# Patient Record
Sex: Male | Born: 1955 | Race: White | Hispanic: No | Marital: Married | Smoking: Never smoker
Health system: Southern US, Community
[De-identification: ages and names within clinical notes are randomized; demographics above are authoritative.]

## PROBLEM LIST (undated history)

## (undated) DIAGNOSIS — J309 Allergic rhinitis, unspecified: Secondary | ICD-10-CM

## (undated) DIAGNOSIS — N529 Male erectile dysfunction, unspecified: Secondary | ICD-10-CM

## (undated) DIAGNOSIS — F411 Generalized anxiety disorder: Secondary | ICD-10-CM

## (undated) DIAGNOSIS — E785 Hyperlipidemia, unspecified: Secondary | ICD-10-CM

## (undated) DIAGNOSIS — Z8781 Personal history of (healed) traumatic fracture: Secondary | ICD-10-CM

## (undated) DIAGNOSIS — R7401 Elevation of levels of liver transaminase levels: Secondary | ICD-10-CM

## (undated) DIAGNOSIS — Z87438 Personal history of other diseases of male genital organs: Secondary | ICD-10-CM

## (undated) DIAGNOSIS — H3320 Serous retinal detachment, unspecified eye: Secondary | ICD-10-CM

## (undated) DIAGNOSIS — E119 Type 2 diabetes mellitus without complications: Secondary | ICD-10-CM

## (undated) DIAGNOSIS — R740 Nonspecific elevation of levels of transaminase and lactic acid dehydrogenase [LDH]: Secondary | ICD-10-CM

## (undated) DIAGNOSIS — G4733 Obstructive sleep apnea (adult) (pediatric): Secondary | ICD-10-CM

## (undated) DIAGNOSIS — E8881 Metabolic syndrome: Principal | ICD-10-CM

## (undated) DIAGNOSIS — L739 Follicular disorder, unspecified: Principal | ICD-10-CM

## (undated) DIAGNOSIS — I4891 Unspecified atrial fibrillation: Secondary | ICD-10-CM

## (undated) DIAGNOSIS — R7303 Prediabetes: Secondary | ICD-10-CM

## (undated) HISTORY — DX: Type 2 diabetes mellitus without complications: E11.9

## (undated) HISTORY — DX: Unspecified atrial fibrillation: I48.91

## (undated) HISTORY — DX: Follicular disorder, unspecified: L73.9

## (undated) HISTORY — DX: Personal history of (healed) traumatic fracture: Z87.81

## (undated) HISTORY — DX: Nonspecific elevation of levels of transaminase and lactic acid dehydrogenase (ldh): R74.0

## (undated) HISTORY — DX: Generalized anxiety disorder: F41.1

## (undated) HISTORY — PX: EYE SURGERY: SHX253

## (undated) HISTORY — DX: Male erectile dysfunction, unspecified: N52.9

## (undated) HISTORY — DX: Elevation of levels of liver transaminase levels: R74.01

## (undated) HISTORY — PX: VASECTOMY: SHX75

## (undated) HISTORY — PX: INGUINAL HERNIA REPAIR: SHX194

## (undated) HISTORY — DX: Hyperlipidemia, unspecified: E78.5

## (undated) HISTORY — DX: Obstructive sleep apnea (adult) (pediatric): G47.33

## (undated) HISTORY — DX: Personal history of other diseases of male genital organs: Z87.438

## (undated) HISTORY — DX: Prediabetes: R73.03

## (undated) HISTORY — DX: Serous retinal detachment, unspecified eye: H33.20

## (undated) HISTORY — DX: Metabolic syndrome: E88.81

## (undated) HISTORY — PX: SHOULDER ARTHROSCOPY W/ SUBACROMIAL DECOMPRESSION AND DISTAL CLAVICLE EXCISION: SHX2401

## (undated) HISTORY — DX: Allergic rhinitis, unspecified: J30.9

## (undated) HISTORY — PX: HEMORRHOID SURGERY: SHX153

## (undated) HISTORY — PX: FRACTURE SURGERY: SHX138

---

## 1981-10-04 HISTORY — PX: MANDIBLE FRACTURE SURGERY: SHX706

## 1995-10-05 DIAGNOSIS — Z8781 Personal history of (healed) traumatic fracture: Secondary | ICD-10-CM

## 1995-10-05 HISTORY — DX: Personal history of (healed) traumatic fracture: Z87.81

## 2003-10-05 HISTORY — PX: CERVICAL DISCECTOMY: SHX98

## 2004-10-04 DIAGNOSIS — I4891 Unspecified atrial fibrillation: Secondary | ICD-10-CM

## 2004-10-04 HISTORY — DX: Unspecified atrial fibrillation: I48.91

## 2009-10-04 DIAGNOSIS — N529 Male erectile dysfunction, unspecified: Secondary | ICD-10-CM

## 2009-10-04 HISTORY — DX: Male erectile dysfunction, unspecified: N52.9

## 2010-06-19 ENCOUNTER — Ambulatory Visit: Payer: Self-pay | Admitting: Unknown Physician Specialty

## 2010-06-19 HISTORY — PX: COLONOSCOPY: SHX174

## 2012-02-07 ENCOUNTER — Ambulatory Visit: Payer: Self-pay | Admitting: Family Medicine

## 2012-02-08 ENCOUNTER — Encounter: Payer: Self-pay | Admitting: Family Medicine

## 2012-02-08 ENCOUNTER — Ambulatory Visit (INDEPENDENT_AMBULATORY_CARE_PROVIDER_SITE_OTHER): Payer: 59 | Admitting: Family Medicine

## 2012-02-08 VITALS — BP 133/80 | HR 50 | Temp 97.0°F | Ht 73.0 in | Wt 219.8 lb

## 2012-02-08 DIAGNOSIS — Z0000 Encounter for general adult medical examination without abnormal findings: Secondary | ICD-10-CM

## 2012-02-08 DIAGNOSIS — Z23 Encounter for immunization: Secondary | ICD-10-CM

## 2012-02-08 DIAGNOSIS — N529 Male erectile dysfunction, unspecified: Secondary | ICD-10-CM

## 2012-02-08 DIAGNOSIS — Z125 Encounter for screening for malignant neoplasm of prostate: Secondary | ICD-10-CM

## 2012-02-08 DIAGNOSIS — I4891 Unspecified atrial fibrillation: Secondary | ICD-10-CM

## 2012-02-08 DIAGNOSIS — R5383 Other fatigue: Secondary | ICD-10-CM

## 2012-02-08 DIAGNOSIS — M533 Sacrococcygeal disorders, not elsewhere classified: Secondary | ICD-10-CM

## 2012-02-08 DIAGNOSIS — R5381 Other malaise: Secondary | ICD-10-CM

## 2012-02-08 DIAGNOSIS — I10 Essential (primary) hypertension: Secondary | ICD-10-CM

## 2012-02-08 MED ORDER — METOPROLOL TARTRATE 50 MG PO TABS: 50.0000 mg | ORAL_TABLET | Freq: Two times a day (BID) | ORAL | Status: DC

## 2012-02-08 MED ORDER — SILDENAFIL CITRATE 50 MG PO TABS: ORAL_TABLET | ORAL | Status: DC

## 2012-02-08 MED ORDER — FLECAINIDE ACETATE 50 MG PO TABS: 50.0000 mg | ORAL_TABLET | Freq: Two times a day (BID) | ORAL | Status: DC

## 2012-02-08 NOTE — Assessment & Plan Note (Signed)
Discussed options today. Decided to do plain film of sacrum/coccyx and refer to interventional pain mgmt MD Dr. Oneal Grout for consideration of repeat steroid/lidocaine injection at sacro-coccyx junction.  Take tylenol 1000mg  q6h prn and continue to use his donut when sitting on hard surfaces.

## 2012-02-08 NOTE — Assessment & Plan Note (Addendum)
Reviewed age and gender appropriate health maintenance issues (prudent diet, regular exercise, health risks of tobacco and excessive alcohol, use of seatbelts, fire alarms in home, use of sunscreen).  Also reviewed age and gender appropriate health screening as well as vaccine recommendations. Tdap given today.  He is UTD on colon cancer screening--next routine screening colonoscopy due 2022. Emphasized importance of intensifying exercise and increasing frequency of exercise.

## 2012-02-08 NOTE — Assessment & Plan Note (Signed)
With decreased energy level lately plus sleep dysfunction will check testosterone level, although he reports his sexual desire to be intact. Reassured pt that this can certainly be a problem totally unrelated to testosterone (stess, psychogenic, vascular, medication effect). Viagra 50mg  was successful in the past per pt so I did rx for this today.

## 2012-02-08 NOTE — Assessment & Plan Note (Signed)
He has been in sinus rhythm for years now since being on Flecainide and he'll continue this and we'll order referral to local [place] cardiologist for convenience now that he lives in this region.  Continue ASA 81mg  qd.

## 2012-02-08 NOTE — Patient Instructions (Signed)
Health Maintenance, Males A healthy lifestyle and preventative care can promote health and wellness.  Maintain regular health, dental, and eye exams.   Eat a healthy diet. Foods like vegetables, fruits, whole grains, low-fat dairy products, and lean protein foods contain the nutrients you need without too many calories. Decrease your intake of foods high in solid fats, added sugars, and salt. Get information about a proper diet from your caregiver, if necessary.   Regular physical exercise is one of the most important things you can do for your health. Most adults should get at least 150 minutes of moderate-intensity exercise (any activity that increases your heart rate and causes you to sweat) each week. In addition, most adults need muscle-strengthening exercises on 2 or more days a week.    Maintain a healthy weight. The body mass index (BMI) is a screening tool to identify possible weight problems. It provides an estimate of body fat based on height and weight. Your caregiver can help determine your BMI, and can help you achieve or maintain a healthy weight. For adults 20 years and older:   A BMI below 18.5 is considered underweight.   A BMI of 18.5 to 24.9 is normal.   A BMI of 25 to 29.9 is considered overweight.   A BMI of 30 and above is considered obese.   Maintain normal blood lipids and cholesterol by exercising and minimizing your intake of saturated fat. Eat a balanced diet with plenty of fruits and vegetables. Blood tests for lipids and cholesterol should begin at age 20 and be repeated every 5 years. If your lipid or cholesterol levels are high, you are over 50, or you are a high risk for heart disease, you may need your cholesterol levels checked more frequently.Ongoing high lipid and cholesterol levels should be treated with medicines, if diet and exercise are not effective.   If you smoke, find out from your caregiver how to quit. If you do not use tobacco, do not start.    If you choose to drink alcohol, do not exceed 2 drinks per day. One drink is considered to be 12 ounces (355 mL) of beer, 5 ounces (148 mL) of wine, or 1.5 ounces (44 mL) of liquor.   Avoid use of street drugs. Do not share needles with anyone. Ask for help if you need support or instructions about stopping the use of drugs.   High blood pressure causes heart disease and increases the risk of stroke. Blood pressure should be checked at least every 1 to 2 years. Ongoing high blood pressure should be treated with medicines if weight loss and exercise are not effective.   If you are 45 to 54 years old, ask your caregiver if you should take aspirin to prevent heart disease.   Diabetes screening involves taking a blood sample to check your fasting blood sugar level. This should be done once every 3 years, after age 45, if you are within normal weight and without risk factors for diabetes. Testing should be considered at a younger age or be carried out more frequently if you are overweight and have at least 1 risk factor for diabetes.   Colorectal cancer can be detected and often prevented. Most routine colorectal cancer screening begins at the age of 50 and continues through age 75. However, your caregiver may recommend screening at an earlier age if you have risk factors for colon cancer. On a yearly basis, your caregiver may provide home test kits to check for hidden   blood in the stool. Use of a small camera at the end of a tube, to directly examine the colon (sigmoidoscopy or colonoscopy), can detect the earliest forms of colorectal cancer. Talk to your caregiver about this at age 50, when routine screening begins. Direct examination of the colon should be repeated every 5 to 10 years through age 75, unless early forms of pre-cancerous polyps or small growths are found.   Hepatitis C blood testing is recommended for all people born from 1945 through 1965 and any individual with known risks for  hepatitis C.   Healthy men should no longer receive prostate-specific antigen (PSA) blood tests as part of routine cancer screening. Consult with your caregiver about prostate cancer screening.   Testicular cancer screening is not recommended for adolescents or adult males who have no symptoms. Screening includes self-exam, caregiver exam, and other screening tests. Consult with your caregiver about any symptoms you have or any concerns you have about testicular cancer.   Practice safe sex. Use condoms and avoid high-risk sexual practices to reduce the spread of sexually transmitted infections (STIs).   Use sunscreen with a sun protection factor (SPF) of 30 or greater. Apply sunscreen liberally and repeatedly throughout the day. You should seek shade when your shadow is shorter than you. Protect yourself by wearing long sleeves, pants, a wide-brimmed hat, and sunglasses year round, whenever you are outdoors.   Notify your caregiver of new moles or changes in moles, especially if there is a change in shape or color. Also notify your caregiver if a mole is larger than the size of a pencil eraser.   A one-time screening for abdominal aortic aneurysm (AAA) and surgical repair of large AAAs by sound wave imaging (ultrasonography) is recommended for ages 65 to 75 years who are current or former smokers.   Stay current with your immunizations.  Document Released: 03/18/2008 Document Revised: 09/09/2011 Document Reviewed: 02/15/2011 [employer] Patient Information 2012 [employer]. 

## 2012-02-08 NOTE — Progress Notes (Signed)
Office Note 02/08/2012  CC:  Chief Complaint  Patient presents with  . Establish Care    new patient    HPI:  Shawn Morales is a 56 y.o. White male who is here to establish care and is due for annual CPE. Patient's most recent primary MD: Dr. Robby Sermon in [place], [place].  Cardiologist is Dr. Cassie Freer in [place]. Old records were not reviewed prior to or during today's visit.  Main complaints:  1) erectile dysfunction, gradual onset, mild (firm enough for penetration but often doesn't maintain erection through orgasm).  Sexual desire is intact.  +Mild fatigue, insidious, mild sleep dysfunction last few weeks.  He wonders about his testosterone level.  2) pain in coccyx region for the last 7 years ever since a skateboarding accident--fell directly on coccyx. Has constant nagging discomfort that is intermittently worsened by sitting on certain hard seating, esp his motorcycle.  He recalls getting an injection a couple of months after the accident--in the region of the sacro-coccygeal junction but says it didn't help any.  Takes occasional tylenol or ibuprofen and it helps some.  Uses a soft donut cushion, esp when riding motorcycle.  Denies paresthesias in legs, weakness in legs, or loss of bowel/bladder control.    Past Medical History  Diagnosis Date  . Hypertension     Hx of good control  . Atrial fibrillation 2006    Converted to sinus with flecainide and has been stable since..  . Allergy     Past Surgical History  Procedure Date  . Cervical discectomy 2007    Very helpful:  twinge of residual numbness in tips of index and middle fingers on left hand.  . Colonoscopy 2012    Screening: normal per pt report [facility]; pt can't recall name of GI MD).    Family History  Problem Relation Age of Onset  . Hypertension Mother   . Leukemia Mother   . Stroke Father     History   Social History  . Marital Status: Married    Spouse Name: N/A    Number of Children: N/A  .  Years of Education: N/A   Occupational History  . Not on file.   Social History Main Topics  . Smoking status: Never Smoker   . Smokeless tobacco: Never Used  . Alcohol Use: No  . Drug Use: No  . Sexually Active: Not on file   Other Topics Concern  . 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    Outpatient Encounter Prescriptions as of 02/08/2012  Medication Sig Dispense Refill  . aspirin 81 MG tablet Take 81 mg by mouth daily.      . fish oil-omega-3 fatty acids 1000 MG capsule Take 2 g by mouth daily.      . flecainide (TAMBOCOR) 50 MG tablet Take 1 tablet (50 mg total) by mouth 2 (two) times daily.  60 tablet  6  . metoprolol (LOPRESSOR) 50 MG tablet Take 1 tablet (50 mg total) by mouth 2 (two) times daily.  60 tablet  6  . Multiple Vitamin (MULTIVITAMIN) tablet Take 1 tablet by mouth daily.      Marland Kitchen DISCONTD: flecainide (TAMBOCOR) 50 MG tablet Take 50 mg by mouth 2 (two) times daily.      Marland Kitchen DISCONTD: metoprolol (LOPRESSOR) 50 MG tablet Take 50 mg by mouth 2 (two) times daily.      Marland Kitchen  sildenafil (VIAGRA) 50 MG tablet 1-2 tabs po 30 min prior to intercourse.  Use only once per 24h.  10 tablet  6    No Known Allergies  ROS Review of Systems  Constitutional: Positive for fatigue (mild). Negative for fever, chills and appetite change.  HENT: Negative for ear pain, congestion, sore throat, neck stiffness and dental problem.   Eyes: Negative for discharge, redness and visual disturbance.  Respiratory: Negative for cough, chest tightness, shortness of breath and wheezing.   Cardiovascular: Negative for chest pain, palpitations and leg swelling.  Gastrointestinal: Negative for nausea, vomiting, abdominal pain, diarrhea and blood in stool.  Genitourinary: Negative for dysuria, urgency, frequency, hematuria, flank pain and difficulty urinating.    Musculoskeletal: Negative for myalgias, back pain, joint swelling and arthralgias.       Coccyx pain  Skin: Negative for pallor and rash.  Neurological: Negative for dizziness, speech difficulty, weakness and headaches.  Hematological: Negative for adenopathy. Does not bruise/bleed easily.  Psychiatric/Behavioral: Negative for confusion and sleep disturbance. The patient is not nervous/anxious.     PE; Blood pressure 133/80, pulse 50, temperature 97 F (36.1 C), temperature source Temporal, height 6\' 1"  (1.854 m), weight 219 lb 12.8 oz (99.701 kg), SpO2 94.00%. Gen: Alert, well appearing white male.  Patient is oriented to person, place, time, and situation. Affect is pleasant.  Thought and speech are lucid. ENT: Ears: EACs clear, normal epithelium.  TMs with good light reflex and landmarks bilaterally.  Eyes: no injection, icteris, swelling, or exudate.  EOMI, PERRLA. Nose: no drainage or turbinate edema/swelling.  No injection or focal lesion.  Mouth: lips without lesion/swelling.  Oral mucosa pink and moist.  Dentition intact and without obvious caries or gingival swelling.  Oropharynx without erythema, exudate, or swelling.  Neck: supple/nontender.  No LAD, mass, or TM.  Carotid pulses 2+ bilaterally, without bruits. CV: RRR, no m/r/g.   LUNGS: CTA bilat, nonlabored resps, good aeration in all lung fields. ABD: soft, NT, ND, BS normal.  No hepatospenomegaly or mass.  No bruits. EXT: no clubbing, cyanosis, or edema.  Genitals normal; both testes normal without tenderness, masses, hydroceles, varicoceles, erythema or swelling. Shaft normal, circumcised, meatus normal without discharge. No inguinal hernia noted. No inguinal lymphadenopathy. Rectal exam: negative without mass, lesions or tenderness, PROSTATE EXAM: smooth and symmetric without nodules or tenderness.  Pertinent labs:  None in office today  ASSESSMENT AND PLAN:   New pt: obtain old records.  Health maintenance  examination Reviewed age and gender appropriate health maintenance issues (prudent diet, regular exercise, health risks of tobacco and excessive alcohol, use of seatbelts, fire alarms in home, use of sunscreen).  Also reviewed age and gender appropriate health screening as well as vaccine recommendations. Tdap given today.  He is UTD on colon cancer screening--next routine screening colonoscopy due 2022. Emphasized importance of intensifying exercise and increasing frequency of exercise.  Coccydynia Discussed options today. Decided to do plain film of sacrum/coccyx and refer to interventional pain mgmt MD Dr. Oneal Grout for consideration of repeat steroid/lidocaine injection at sacro-coccyx junction.  Take tylenol 1000mg  q6h prn and continue to use his donut when sitting on hard surfaces.  Erectile dysfunction With decreased energy level lately plus sleep dysfunction will check testosterone level, although he reports his sexual desire to be intact. Reassured pt that this can certainly be a problem totally unrelated to testosterone (stess, psychogenic, vascular, medication effect). Viagra 50mg  was successful in the past per pt so I did rx for this today.  Prostate cancer screening DRE today normal. PSA ordered and will be done as part of his fasting labs when he returns for these ASAP.  Atrial fibrillation He has been in sinus rhythm for years now since being on Flecainide and he'll continue this and we'll order referral to local [place] cardiologist for convenience now that he lives in this region.  Continue ASA 81mg  qd.  HTN (hypertension), benign Problem stable.  Continue current medications and diet appropriate for this condition.  We have reviewed our general long term plan for this problem and also reviewed symptoms and signs that should prompt the patient to call or return to the office. Check Cr/lytes today. Will check FLP when fasting ASAP.     Return in about 6 months (around  08/10/2012) for f/u HTN and a-fib.

## 2012-02-08 NOTE — Assessment & Plan Note (Signed)
DRE today normal. PSA ordered and will be done as part of his fasting labs when he returns for these ASAP.

## 2012-02-08 NOTE — Assessment & Plan Note (Signed)
Problem stable.  Continue current medications and diet appropriate for this condition.  We have reviewed our general long term plan for this problem and also reviewed symptoms and signs that should prompt the patient to call or return to the office. Check Cr/lytes today. Will check FLP when fasting ASAP.

## 2012-02-09 ENCOUNTER — Other Ambulatory Visit (INDEPENDENT_AMBULATORY_CARE_PROVIDER_SITE_OTHER): Payer: 59

## 2012-02-09 ENCOUNTER — Other Ambulatory Visit: Payer: Self-pay | Admitting: Family Medicine

## 2012-02-09 ENCOUNTER — Encounter: Payer: Self-pay | Admitting: Family Medicine

## 2012-02-09 ENCOUNTER — Ambulatory Visit (HOSPITAL_BASED_OUTPATIENT_CLINIC_OR_DEPARTMENT_OTHER)
Admission: RE | Admit: 2012-02-09 | Discharge: 2012-02-09 | Disposition: A | Discharge status: Home / Self Care | Payer: 59 | Source: Ambulatory Visit | Attending: Family Medicine | Admitting: Family Medicine

## 2012-02-09 DIAGNOSIS — R7301 Impaired fasting glucose: Secondary | ICD-10-CM | POA: Insufficient documentation

## 2012-02-09 DIAGNOSIS — M545 Low back pain, unspecified: Secondary | ICD-10-CM | POA: Insufficient documentation

## 2012-02-09 DIAGNOSIS — N529 Male erectile dysfunction, unspecified: Secondary | ICD-10-CM

## 2012-02-09 DIAGNOSIS — Z0000 Encounter for general adult medical examination without abnormal findings: Secondary | ICD-10-CM

## 2012-02-09 DIAGNOSIS — G8929 Other chronic pain: Secondary | ICD-10-CM

## 2012-02-09 DIAGNOSIS — Z125 Encounter for screening for malignant neoplasm of prostate: Secondary | ICD-10-CM

## 2012-02-09 DIAGNOSIS — Z23 Encounter for immunization: Secondary | ICD-10-CM

## 2012-02-09 DIAGNOSIS — E291 Testicular hypofunction: Secondary | ICD-10-CM

## 2012-02-09 DIAGNOSIS — M533 Sacrococcygeal disorders, not elsewhere classified: Secondary | ICD-10-CM | POA: Insufficient documentation

## 2012-02-09 DIAGNOSIS — I4891 Unspecified atrial fibrillation: Secondary | ICD-10-CM

## 2012-02-09 DIAGNOSIS — R5381 Other malaise: Secondary | ICD-10-CM

## 2012-02-09 DIAGNOSIS — I10 Essential (primary) hypertension: Secondary | ICD-10-CM

## 2012-02-09 DIAGNOSIS — R5383 Other fatigue: Secondary | ICD-10-CM

## 2012-02-09 DIAGNOSIS — R7401 Elevation of levels of liver transaminase levels: Secondary | ICD-10-CM

## 2012-02-09 DIAGNOSIS — R7402 Elevation of levels of lactic acid dehydrogenase (LDH): Secondary | ICD-10-CM

## 2012-02-09 LAB — COMPREHENSIVE METABOLIC PANEL
ALT: 64 U/L — ABNORMAL HIGH (ref 0–53)
CO2: 29 mEq/L (ref 19–32)
Calcium: 9.8 mg/dL (ref 8.4–10.5)
Chloride: 101 mEq/L (ref 96–112)
Creatinine, Ser: 1 mg/dL (ref 0.4–1.5)
GFR: 83.65 mL/min (ref >60.00)
Glucose, Bld: 110 mg/dL — ABNORMAL HIGH (ref 70–99)
Total Protein: 7.5 g/dL (ref 6.0–8.3)

## 2012-02-09 LAB — LIPID PANEL
Total CHOL/HDL Ratio: 5
VLDL: 50.8 mg/dL — ABNORMAL HIGH (ref 0.0–40.0)

## 2012-02-09 LAB — CBC WITH DIFFERENTIAL/PLATELET
Basophils Relative: 0.6 % (ref 0.0–3.0)
Eosinophils Relative: 1.2 % (ref 0.0–5.0)
HCT: 46.4 % (ref 39.0–52.0)
Hemoglobin: 15.9 g/dL (ref 13.0–17.0)
Lymphs Abs: 1.7 10*3/uL (ref 0.7–4.0)
MCV: 88.8 fl (ref 78.0–100.0)
Monocytes Relative: 10.2 % (ref 3.0–12.0)
Platelets: 234 10*3/uL (ref 150.0–400.0)
RBC: 5.23 Mil/uL (ref 4.22–5.81)
WBC: 7.1 10*3/uL (ref 4.5–10.5)

## 2012-02-09 LAB — TESTOSTERONE: Testosterone: 294.25 ng/dL — ABNORMAL LOW (ref 350.00–890.00)

## 2012-02-09 LAB — TSH: TSH: 0.86 u[IU]/mL (ref 0.35–5.50)

## 2012-02-09 LAB — LDL CHOLESTEROL, DIRECT: Direct LDL: 127.8 mg/dL

## 2012-02-09 NOTE — Telephone Encounter (Signed)
Detailed message left on cell number that RX's were received by pharmacy on 5/7.  Flecainide and metoprolol were put on hold due to previous RX's in system and recent refill history.  Viagra may have also been put on hold, but pharmacist will try to fill again to see if there are any insurance issues.  Advised pt in voicemail to call pharmacy before going to pick up meds to make sure they are ready.  To call with any other questions/concerns.

## 2012-02-09 NOTE — Telephone Encounter (Signed)
I spoke with patient and told him that we would speak with [facility] verbally to ensure his medications are filled.

## 2012-02-16 ENCOUNTER — Institutional Professional Consult (permissible substitution): Payer: 59 | Admitting: Cardiology

## 2012-02-17 ENCOUNTER — Encounter: Payer: Self-pay | Admitting: Family Medicine

## 2012-04-03 ENCOUNTER — Telehealth: Payer: Self-pay | Admitting: Family Medicine

## 2012-04-03 NOTE — Telephone Encounter (Signed)
Sure.  If pt calls then we'll take it from there.--thx

## 2012-04-03 NOTE — Telephone Encounter (Signed)
Patty from [facility] called to say that Dr Oneal Grout is on bedrest for the next 6 weeks & that she would not be able to see the patient for 6 weeks. Can you manage his meds until she returns to work?

## 2012-04-11 ENCOUNTER — Ambulatory Visit (INDEPENDENT_AMBULATORY_CARE_PROVIDER_SITE_OTHER): Payer: 59 | Admitting: Family Medicine

## 2012-04-11 ENCOUNTER — Encounter: Payer: Self-pay | Admitting: Family Medicine

## 2012-04-11 VITALS — BP 134/80 | HR 59 | Temp 98.0°F | Resp 18 | Wt 220.1 lb

## 2012-04-11 DIAGNOSIS — L739 Follicular disorder, unspecified: Secondary | ICD-10-CM

## 2012-04-11 DIAGNOSIS — L738 Other specified follicular disorders: Secondary | ICD-10-CM

## 2012-04-11 DIAGNOSIS — I10 Essential (primary) hypertension: Secondary | ICD-10-CM

## 2012-04-11 DIAGNOSIS — I4891 Unspecified atrial fibrillation: Secondary | ICD-10-CM

## 2012-04-11 DIAGNOSIS — L678 Other hair color and hair shaft abnormalities: Secondary | ICD-10-CM

## 2012-04-11 MED ORDER — MUPIROCIN 2 % EX OINT: TOPICAL_OINTMENT | Freq: Every day | CUTANEOUS | Status: AC

## 2012-04-11 MED ORDER — SULFAMETHOXAZOLE-TRIMETHOPRIM 800-160 MG PO TABS: 1.0000 | ORAL_TABLET | Freq: Two times a day (BID) | ORAL | Status: AC

## 2012-04-11 NOTE — Assessment & Plan Note (Signed)
RRR today 

## 2012-04-11 NOTE — Assessment & Plan Note (Signed)
Suspicious for staph, culture taken today, started on Bactrim DS bid and Mupirocin to nares qhs x 7 days. Start a probiotic

## 2012-04-11 NOTE — Patient Instructions (Addendum)
Folliculitis  Folliculitis is an infection and inflammation of the hair follicles. Hair follicles become red and irritated. This inflammation is usually caused by bacteria. The bacteria thrive in warm, moist environments. This condition can be seen anywhere on the body.  CAUSES The most common cause of folliculitis is an infection by germs (bacteria). Fungal and viral infections can also cause the condition. Viral infections may be more common in people whose bodies are unable to fight disease well (weakened immune systems). Examples include people with:  AIDS.   An organ transplant.   Cancer.  People with depressed immune systems, diabetes, or obesity, have a greater risk of getting folliculitis than the general population. Certain chemicals, especially oils and tars, also can cause folliculitis. SYMPTOMS  An early sign of folliculitis is a small, white or yellow pus-filled, itchy lesion (pustule). These lesions appear on a red, inflamed follicle. They are usually less than 5 mm (.20 inches).   The most likely starting points are the scalp, thighs, legs, back and buttocks. Folliculitis is also frequently found in areas of repeated shaving.   When an infection of the follicle goes deeper, it becomes a boil or furuncle. A group of closely packed boils create a larger lesion (a carbuncle). These sores (lesions) tend to occur in hairy, sweaty areas of the body.  TREATMENT   A doctor who specializes in skin problems (dermatologists) treats mild cases of folliculitis with antiseptic washes.   They also use a skin application which kills germs (topical antibiotics). Tea tree oil is a good topical antiseptic as well. It can be found at a health food store. A small percentage of individuals may develop an allergy to the tea tree oil.   Mild to moderate boils respond well to warm water compresses applied three times daily.   In some cases, oral antibiotics should be taken with the skin treatment.     If lesions contain large quantities of pus or fluid, your caregiver may drain them. This allows the topical antibiotics to get to the affected areas better.   Stubborn cases of folliculitis may respond to laser hair removal. This process uses a high intensity light beam (a laser) to destroy the follicle and reduces the scarring from folliculitis. After laser hair removal, hair will no longer grow in the laser treated area.  Patients with long-lasting folliculitis need to find out where the infection is coming from. Germs can live in the nostrils of the patient. This can trigger an outbreak now and then. Sometimes the bacteria live in the nostrils of a family member. This person does not develop the disorder but they repeatedly re-expose others to the germ. To break the cycle of recurrence in the patient, the family member must also undergo treatment. PREVENTION   Individuals who are predisposed to folliculitis should be extremely careful about personal hygiene.   Application of antiseptic washes may help prevent recurrences.   A topical antibiotic cream, mupirocin (Bactroban), has been effective at reducing bacteria in the nostrils. It is applied inside the nose with your little finger. This is done twice daily for a week. Then it is repeated every 6 months.   Because follicle disorders tend to come back, patients must receive follow-up care. Your caregiver may be able to recognize a recurrence before it becomes severe.  SEEK IMMEDIATE MEDICAL CARE IF:   You develop redness, swelling, or increasing pain in the area.   You have a fever.   You are not improving with treatment  or are getting worse.   You have any other questions or concerns.  Document Released: 11/29/2001 Document Revised: 09/09/2011 Document Reviewed: 09/25/2008 [facility] Patient Information 2012 [place], [place].  Take a probiotic daily for the next month

## 2012-04-11 NOTE — Assessment & Plan Note (Signed)
Improved control on recheck.  
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
## 2012-04-14 LAB — WOUND CULTURE: Gram Stain: NONE SEEN

## 2012-07-12 ENCOUNTER — Telehealth: Payer: Self-pay | Admitting: Family Medicine

## 2012-07-12 NOTE — Telephone Encounter (Signed)
Noted-PM 

## 2012-07-12 NOTE — Telephone Encounter (Signed)
Message copied by Jeoffrey Massed on Wed Jul 12, 2012  4:30 PM ------      Message from: Carmelia Bake      Created: Wed Jul 12, 2012  3:30 PM      Regarding: RE: pain mgmt referall       SW patient, he is going to call them to set up an appointment      ----- Message -----         From: Jeoffrey Massed, MD         Sent: 07/03/2012   8:12 AM           To: Veryl Speak [place]      Subject: pain mgmt referall                                       I got correspondence from [facility] in [place] stating that they could take him as a patient BUT they could not get in contact with him.  Please give pt a call and make sure he knows this, give him their number if he doesn't have it: [phone]. -thx

## 2012-07-31 ENCOUNTER — Other Ambulatory Visit (INDEPENDENT_AMBULATORY_CARE_PROVIDER_SITE_OTHER): Payer: 59

## 2012-07-31 DIAGNOSIS — R7401 Elevation of levels of liver transaminase levels: Secondary | ICD-10-CM

## 2012-07-31 DIAGNOSIS — E291 Testicular hypofunction: Secondary | ICD-10-CM

## 2012-07-31 DIAGNOSIS — E785 Hyperlipidemia, unspecified: Secondary | ICD-10-CM

## 2012-07-31 DIAGNOSIS — R7301 Impaired fasting glucose: Secondary | ICD-10-CM

## 2012-07-31 LAB — LDL CHOLESTEROL, DIRECT: Direct LDL: 108.5 mg/dL

## 2012-07-31 LAB — LIPID PANEL
Cholesterol: 195 mg/dL (ref 0–200)
HDL: 33.6 mg/dL — ABNORMAL LOW (ref >39.00)
Triglycerides: 297 mg/dL — ABNORMAL HIGH (ref 0.0–149.0)

## 2012-07-31 LAB — HEMOGLOBIN A1C: Hgb A1c MFr Bld: 5.8 % (ref 4.6–6.5)

## 2012-07-31 LAB — GLUCOSE, RANDOM: Glucose, Bld: 111 mg/dL — ABNORMAL HIGH (ref 70–99)

## 2012-07-31 LAB — ALT: ALT: 58 U/L — ABNORMAL HIGH (ref 0–53)

## 2012-08-10 ENCOUNTER — Ambulatory Visit (INDEPENDENT_AMBULATORY_CARE_PROVIDER_SITE_OTHER): Payer: 59 | Admitting: Family Medicine

## 2012-08-10 ENCOUNTER — Encounter: Payer: Self-pay | Admitting: Family Medicine

## 2012-08-10 VITALS — BP 117/75 | HR 57 | Temp 97.8°F | Ht 73.0 in | Wt 215.0 lb

## 2012-08-10 DIAGNOSIS — M533 Sacrococcygeal disorders, not elsewhere classified: Secondary | ICD-10-CM

## 2012-08-10 DIAGNOSIS — R7401 Elevation of levels of liver transaminase levels: Secondary | ICD-10-CM

## 2012-08-10 DIAGNOSIS — E291 Testicular hypofunction: Secondary | ICD-10-CM

## 2012-08-10 DIAGNOSIS — E8881 Metabolic syndrome: Secondary | ICD-10-CM

## 2012-08-10 DIAGNOSIS — I10 Essential (primary) hypertension: Secondary | ICD-10-CM

## 2012-08-10 MED ORDER — FLECAINIDE ACETATE 50 MG PO TABS: 50.0000 mg | ORAL_TABLET | Freq: Two times a day (BID) | ORAL | Status: DC

## 2012-08-10 MED ORDER — METOPROLOL TARTRATE 50 MG PO TABS: 50.0000 mg | ORAL_TABLET | Freq: Two times a day (BID) | ORAL | Status: DC

## 2012-08-10 NOTE — Assessment & Plan Note (Signed)
Problem stable.  Continue current medications and diet appropriate for this condition.  We have reviewed our general long term plan for this problem and also reviewed symptoms and signs that should prompt the patient to call or return to the office.  

## 2012-08-10 NOTE — Patient Instructions (Addendum)
Goal exercise regimen:  30 minutes of cardiovascular exercise (HR up to 150s) five out of seven days a week. A little is better than none.  Start small and increase slowly.  Diet: limit portion sizes, no second helpings.  Minimize desserts/treats.  AVOID eating out ESPECIALLY at fast food restaurants. Avoid trans fats and high cholesterol foods.

## 2012-08-10 NOTE — Assessment & Plan Note (Signed)
The last two metabolic panels showed mildly elevated ALT. Discussed this with pt, will do repeat AST/ALT and will do hepatitis B and C screening at next f/u in 6 months. At this time he wants to hold off on verifying suspected fatty liver with abdominal ultrasound.

## 2012-08-10 NOTE — Assessment & Plan Note (Addendum)
Discussed dx and it's associated CV risks. Discussed treatment options including meds and TLC.  He has chosen to try more aggressive attempts at exercise and diet. Rec's for patient:   Goal exercise regimen:  30 minutes of cardiovascular exercise (HR up to 150s) five out of seven days a week. A little is better than none.  Start small and increase slowly.  Diet: limit portion sizes, no second helpings.  Minimize desserts/treats.  AVOID eating out ESPECIALLY at fast food restaurants. Avoid trans fats and high cholesterol foods.  He declined a referral to a nutritionist at this time.

## 2012-08-10 NOTE — Assessment & Plan Note (Signed)
He has appt with pain mgmt specialist 08/30/12 (Dr. Oneal Grout).

## 2012-08-10 NOTE — Assessment & Plan Note (Signed)
Discussed dx. Discussed risks/benefits of treatment or no treatment.  He says he is not too bothered by his ED and/or mild lack of sexual desire.   He wants to hold off on any testosterone supplement at this time and I agree with this choice given his inherent increased risk of CV disease (based on dx of metabolic syndrome).
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
## 2012-10-26 ENCOUNTER — Ambulatory Visit (INDEPENDENT_AMBULATORY_CARE_PROVIDER_SITE_OTHER): Payer: 59 | Admitting: Family Medicine

## 2012-10-26 ENCOUNTER — Encounter: Payer: Self-pay | Admitting: Family Medicine

## 2012-10-26 VITALS — BP 133/80 | HR 55 | Temp 99.0°F | Ht 73.0 in | Wt 222.0 lb

## 2012-10-26 DIAGNOSIS — Z0000 Encounter for general adult medical examination without abnormal findings: Secondary | ICD-10-CM

## 2012-10-26 LAB — POCT URINALYSIS DIPSTICK
Blood, UA: NEGATIVE
Ketones, UA: NEGATIVE
Protein, UA: NEGATIVE
Spec Grav, UA: 1.02
pH, UA: 5.5

## 2012-10-26 NOTE — Assessment & Plan Note (Signed)
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 He'll be following up with his eye MD soon.   He is stable from a chronic medical problem standpoint.
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
## 2012-11-15 ENCOUNTER — Other Ambulatory Visit: Payer: Self-pay | Admitting: Family Medicine

## 2012-11-15 NOTE — Telephone Encounter (Signed)
eScribe request for refill on FLECAINIDE Last filled - 02/08/12, #60 X 6 RX sent  eScribe request for refill on METOPROLOL Last filled - 02/08/12, #60 X 6 Last seen on - 10/26/12 Follow up -  RX sent

## 2013-01-21 ENCOUNTER — Ambulatory Visit (INDEPENDENT_AMBULATORY_CARE_PROVIDER_SITE_OTHER): Payer: 59 | Admitting: Family Medicine

## 2013-01-21 ENCOUNTER — Ambulatory Visit: Payer: 59

## 2013-01-21 VITALS — BP 126/76 | HR 55 | Temp 97.7°F | Resp 14 | Ht 74.0 in | Wt 223.8 lb

## 2013-01-21 DIAGNOSIS — S63509A Unspecified sprain of unspecified wrist, initial encounter: Secondary | ICD-10-CM

## 2013-01-21 DIAGNOSIS — S63501A Unspecified sprain of right wrist, initial encounter: Secondary | ICD-10-CM

## 2013-01-21 DIAGNOSIS — S63601A Unspecified sprain of right thumb, initial encounter: Secondary | ICD-10-CM

## 2013-01-21 DIAGNOSIS — M25531 Pain in right wrist: Secondary | ICD-10-CM

## 2013-01-21 DIAGNOSIS — M79644 Pain in right finger(s): Secondary | ICD-10-CM

## 2013-01-21 DIAGNOSIS — M25539 Pain in unspecified wrist: Secondary | ICD-10-CM

## 2013-01-21 DIAGNOSIS — S6390XA Sprain of unspecified part of unspecified wrist and hand, initial encounter: Secondary | ICD-10-CM

## 2013-01-21 DIAGNOSIS — M79609 Pain in unspecified limb: Secondary | ICD-10-CM

## 2013-01-21 NOTE — Patient Instructions (Addendum)
1.  WEAR WRIST SPLINT DURING THE DAY. 2.  CONTINUE TO ICE WRIST AND THUMB DAILY. 3. RETURN IN ONE WEEK FOR REPEAT XRAY TO EVALUATE SCAPHOID BONE AND TO CONFIRM NO FRACTURE OF SCAPHOID BONE.
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
## 2013-02-08 NOTE — Progress Notes (Signed)
Left msg for pt to call to schedule follow up appt with Dr. Katrinka Blazing.

## 2013-02-12 ENCOUNTER — Ambulatory Visit: Payer: 59

## 2013-02-12 ENCOUNTER — Ambulatory Visit (INDEPENDENT_AMBULATORY_CARE_PROVIDER_SITE_OTHER): Payer: 59 | Admitting: Family Medicine

## 2013-02-12 VITALS — BP 129/82 | HR 58 | Temp 98.0°F | Resp 16 | Ht 73.0 in | Wt 219.0 lb

## 2013-02-12 DIAGNOSIS — M79609 Pain in unspecified limb: Secondary | ICD-10-CM

## 2013-02-12 DIAGNOSIS — M79644 Pain in right finger(s): Secondary | ICD-10-CM

## 2013-02-12 DIAGNOSIS — M25539 Pain in unspecified wrist: Secondary | ICD-10-CM

## 2013-02-12 DIAGNOSIS — M25531 Pain in right wrist: Secondary | ICD-10-CM

## 2013-02-12 NOTE — Progress Notes (Signed)
Pt cannot a schedule f-up appt due to his work schedule. He will follow up tonight at 102 after work.
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
## 2013-02-14 ENCOUNTER — Telehealth: Payer: Self-pay

## 2013-02-14 NOTE — Telephone Encounter (Signed)
Patient would like to pick up xrays of hand that he had done to take to an appt that he has on Monday please call when ready to pick up

## 2013-02-14 NOTE — Telephone Encounter (Signed)
Request given to xray 

## 2013-02-16 ENCOUNTER — Telehealth: Payer: Self-pay

## 2013-02-16 NOTE — Telephone Encounter (Signed)
Pt called to check on status of his xrays and nothing was ready in pick up drawer  Best number [phone]

## 2013-02-16 NOTE — Telephone Encounter (Signed)
Request given to xray 

## 2013-04-19 DIAGNOSIS — S6991XA Unspecified injury of right wrist, hand and finger(s), initial encounter: Secondary | ICD-10-CM | POA: Insufficient documentation

## 2013-05-17 ENCOUNTER — Ambulatory Visit (INDEPENDENT_AMBULATORY_CARE_PROVIDER_SITE_OTHER): Payer: 59 | Admitting: Nurse Practitioner

## 2013-05-17 ENCOUNTER — Encounter: Payer: Self-pay | Admitting: Nurse Practitioner

## 2013-05-17 ENCOUNTER — Other Ambulatory Visit: Payer: Self-pay | Admitting: Family Medicine

## 2013-05-17 VITALS — BP 102/80 | HR 59 | Temp 98.2°F | Ht 73.0 in | Wt 223.5 lb

## 2013-05-17 DIAGNOSIS — I10 Essential (primary) hypertension: Secondary | ICD-10-CM

## 2013-05-17 DIAGNOSIS — I491 Atrial premature depolarization: Secondary | ICD-10-CM

## 2013-05-17 DIAGNOSIS — R319 Hematuria, unspecified: Secondary | ICD-10-CM

## 2013-05-17 DIAGNOSIS — R300 Dysuria: Secondary | ICD-10-CM

## 2013-05-17 DIAGNOSIS — Z8679 Personal history of other diseases of the circulatory system: Secondary | ICD-10-CM

## 2013-05-17 LAB — POCT URINALYSIS DIPSTICK
Bilirubin, UA: NEGATIVE
Glucose, UA: NEGATIVE
Ketones, UA: NEGATIVE
Nitrite, UA: NEGATIVE
Spec Grav, UA: >=1.03
pH, UA: 5.5

## 2013-05-17 MED ORDER — METOPROLOL TARTRATE 50 MG PO TABS: 50.0000 mg | ORAL_TABLET | Freq: Two times a day (BID) | ORAL | Status: DC

## 2013-05-17 MED ORDER — CIPROFLOXACIN HCL 250 MG PO TABS: 250.0000 mg | ORAL_TABLET | Freq: Two times a day (BID) | ORAL | Status: DC

## 2013-05-17 MED ORDER — FLECAINIDE ACETATE 50 MG PO TABS
50.0000 mg | ORAL_TABLET | Freq: Two times a day (BID) | ORAL | Status: DC
Start: 2013-05-17 — End: 2013-05-30

## 2013-05-17 NOTE — Telephone Encounter (Signed)
Denied medication.  Patient needs an appointment for any refills.  Faxed denial to pharmacy.

## 2013-05-17 NOTE — Progress Notes (Signed)
Subjective:    Shawn Morales is a 57 y.o. male who presents for follow-up of atrial fibrillation and hypertension. He also c/o dysuria, and urgency for 1 day.  Regarding a-fib, onset was 10 years ago, and patient reports symptoms have completely resolved since that time. He has been treated with flecainide and metoprolol for many years with no dose adjustments. He last saw a cardiologist in 2011. Our last note from cardiology is dated 2008.The patient denies chest pain, cough, dizziness, leg swelling, palpitations, shortness of breath and syncope. He mentions that he feels tired often and does not always have energy to do things he wants to do.  Regarding dysuria, symptom started yesterday, accompanied by urgency & pressure. Of note, he was sick last week with URI & cough, chills & fatigue. Wife was sick also. All URI symptoms have resolved.   The following portions of the patient's history were reviewed and updated as appropriate: allergies, current medications, past family history, past medical history, past social history, past surgical history and problem list.  Review of Systems Constitutional: see above Ears, nose, mouth, throat, and face: positive for nasal congestion and sore throat Respiratory: positive for cough Cardiovascular: negative for chest pain, irregular heart beat and near-syncope Gastrointestinal: negative for constipation, diarrhea and nausea Genitourinary:positive for no new sexual partners, low risk for STI, dysuria, frequency and hesitancy Musculoskeletal:achy muscles, now resolved Endocrine: negative for diabetic symptoms including blurry vision, polydipsia, polyphagia and polyuria and temperature intolerance    Objective:    BP 102/80  Pulse 59  Temp(Src) 98.2 F (36.8 C) (Oral)  Ht 6\' 1"  (1.854 m)  Wt 223 lb 8 oz (101.379 kg)  BMI 29.49 kg/m2  SpO2 95% General appearance: alert, cooperative,no distress Head: Normocephalic, without obvious abnormality,  atraumatic Eyes: negative findings: lids and lashes normal, conjunctivae and sclerae normal, corneas clear and pupils equal, round, reactive to light and accomodation Ears: normal TM's and external ear canals both ears Throat: lips, mucosa, and tongue normal; teeth and gums normal Lungs: clear to auscultation bilaterally Heart: regular rate and rhythm, S1, S2 normal, no murmur, click, rub or gallop Lymph nodes: Cervical, supraclavicular, and axillary nodes normal. Cardiographics ECG: occasional premature atrial beats    Assessment:   Bradycardia w/PAC, Hx a-fib controlled with flecainide HTN, well controlled Bp in ofc 102/80, p 59 , on metoprolol Dysuria times 1 day, lysed blood in urine, no leuks or nites Plan:   Ref to cardiology, continue meds Treat w/cipro, send urine for culture & microanalysis. F/u in 4-6 weeks to recheck urine.

## 2013-05-17 NOTE — Patient Instructions (Addendum)
I am not certain what is causing your urinary symptoms, as the urinalysis does not show infection. However, given your symptoms, I will treat you with cipro and send your urine for further testing. Increase water intake -sip every hour. Your urine should be re-checked in 4-6 weeks. Your ECG in the office today shows a premature atrial contraction. I would like for you to see cardiology, for evaluation given your history of atrial fib.    Dysuria Dysuria is the medical term for pain with urination. There are many causes for dysuria, but urinary tract infection is the most common. If a urinalysis was performed it can show that there is a urinary tract infection. A urine culture confirms that you or your child is sick. You will need to follow up with a healthcare provider because:  If a urine culture was done you will need to know the culture results and treatment recommendations.  If the urine culture was positive, you or your child will need to be put on antibiotics or know if the antibiotics prescribed are the right antibiotics for your urinary tract infection.  If the urine culture is negative (no urinary tract infection), then other causes may need to be explored or antibiotics need to be stopped. Today laboratory work may have been done and there does not seem to be an infection. If cultures were done they will take at least 24 to 48 hours to be completed. Today x-rays may have been taken and they read as normal. No cause can be found for the problems. The x-rays may be re-read by a radiologist and you will be contacted if additional findings are made. You or your child may have been put on medications to help with this problem until you can see your primary caregiver. If the problems get better, see your primary caregiver if the problems return. If you were given antibiotics (medications which kill germs), take all of the mediations as directed for the full course of treatment.  If laboratory work  was done, you need to find the results. Leave a telephone number where you can be reached. If this is not possible, make sure you find out how you are to get test results. HOME CARE INSTRUCTIONS   Drink lots of fluids. For adults, drink eight, 8 ounce glasses of clear juice or water a day. For children, replace fluids as suggested by your caregiver.  Empty the bladder often. Avoid holding urine for long periods of time.  After a bowel movement, women should cleanse front to back, using each tissue only once.  Empty your bladder before and after sexual intercourse.  Take all the medicine given to you until it is gone. You may feel better in a few days, but TAKE ALL MEDICINE.  Avoid caffeine, tea, alcohol and carbonated beverages, because they tend to irritate the bladder.  In men, alcohol may irritate the prostate.  Only take over-the-counter or prescription medicines for pain, discomfort, or fever as directed by your caregiver.  If your caregiver has given you a follow-up appointment, it is very important to keep that appointment. Not keeping the appointment could result in a chronic or permanent injury, pain, and disability. If there is any problem keeping the appointment, you must call back to this facility for assistance. SEEK IMMEDIATE MEDICAL CARE IF:   Back pain develops.  A fever develops.  There is nausea (feeling sick to your stomach) or vomiting (throwing up).  Problems are no better with medications or are  getting worse. MAKE SURE YOU:   Understand these instructions.  Will watch your condition.  Will get help right away if you are not doing well or get worse. Document Released: 06/18/2004 Document Revised: 12/13/2011 Document Reviewed: 04/25/2008 [facility] Patient Information 2014 [place], [place].  Premature Beats A premature beat is an extra heartbeat that happens earlier than normal. Premature beats are called premature atrial contractions (PACs) or premature  ventricular contractions (PVCs) depending on the area of the heart where they start. CAUSES  Premature beats may be brought on by a variety of factors including:  Emotional stress.  Lack of sleep.  Caffeine.  Asthma medicines.  Stimulants.  Herbal teas.  Dietary supplements.  Alcohol. In most cases, premature beats are not dangerous and are not a sign of serious heart disease. Most patients evaluated for premature beats have completely normal heart function. Rarely, premature beats may be a sign of more significant heart problems or medical illness. SYMPTOMS  Premature beats may cause palpitations. This means you feel like your heart is skipping a beat or beating harder than usual. Sometimes, slight chest pain occurs with premature beats, lasting only a few seconds. This pain has been described as a "flopping" feeling inside the chest. In many cases, premature beats do not cause any symptoms and they are only detected when an electrocardiography test (EKG) or heart monitoring is performed. DIAGNOSIS  Your caregiver may run some tests to evaluate your heart such as an EKG or echocardiography. You may need to wear a portable heart monitor for several days to record the electrical activity of your heart. Blood testing may also be performed to check your electrolytes and thyroid function. TREATMENT  Premature beats usually go away with rest. If the problem continues, your caregiver will determine a treatment plan for you.  HOME CARE INSTRUCTIONS  Get plenty of rest over the next few days until your symptoms improve.  Avoid coffee, tea, alcohol, and soda (pop, cola).  Do not smoke. SEEK MEDICAL CARE IF:  Your symptoms continue after 1 to 2 days of rest.  You have new symptoms, such as chest pain or trouble breathing. SEEK IMMEDIATE MEDICAL CARE IF:  You have severe chest pain or abdominal pain.  You have pain that radiates into the neck, arm, or jaw.  You faint or have extreme  weakness.  You have shortness of breath.  Your heartbeat races for more than 5 seconds. MAKE SURE YOU:  Understand these instructions.  Will watch your condition.  Will get help right away if you are not doing well or get worse. Document Released: 10/28/2004 Document Revised: 12/13/2011 Document Reviewed: 05/24/2011 [facility] Patient Information 2014 [place], [place].

## 2013-05-18 ENCOUNTER — Telehealth: Payer: Self-pay | Admitting: Nurse Practitioner

## 2013-05-18 LAB — URINALYSIS, ROUTINE W REFLEX MICROSCOPIC
Bilirubin Urine: NEGATIVE
Ketones, ur: NEGATIVE mg/dL
Specific Gravity, Urine: 1.027 (ref 1.005–1.030)
Urobilinogen, UA: 0.2 mg/dL (ref 0.0–1.0)
pH: 5.5 (ref 5.0–8.0)

## 2013-05-18 LAB — URINALYSIS, MICROSCOPIC ONLY
Casts: NONE SEEN
Crystals: NONE SEEN

## 2013-05-18 LAB — GC PROBE AMPLIFICATION, URINE: GC Probe Amp, Urine: NEGATIVE

## 2013-05-18 NOTE — Telephone Encounter (Signed)
LMOVM for patient to return call.

## 2013-05-19 LAB — URINE CULTURE: Colony Count: >=100000

## 2013-05-21 ENCOUNTER — Telehealth: Payer: Self-pay | Admitting: Nurse Practitioner

## 2013-05-21 DIAGNOSIS — N390 Urinary tract infection, site not specified: Secondary | ICD-10-CM

## 2013-05-21 MED ORDER — SULFAMETHOXAZOLE-TMP DS 800-160 MG PO TABS: 1.0000 | ORAL_TABLET | Freq: Two times a day (BID) | ORAL | Status: DC

## 2013-05-21 NOTE — Telephone Encounter (Signed)
Urine culture growing e. Coli >100,000 colonies. Resistant to cipro. Will d/c & start bactrim. Will call pt & rec. F/u in 3-4 weeks or sooner if symptoms worse.

## 2013-05-21 NOTE — Telephone Encounter (Signed)
LMOVM for patient to return call concerning results.  

## 2013-05-21 NOTE — Telephone Encounter (Signed)
Patient notified and given advise.

## 2013-05-21 NOTE — Telephone Encounter (Signed)
Patient notified of results.

## 2013-05-22 ENCOUNTER — Ambulatory Visit: Payer: 59 | Admitting: Family Medicine

## 2013-05-23 ENCOUNTER — Ambulatory Visit: Payer: 59 | Admitting: Family Medicine

## 2013-05-30 ENCOUNTER — Encounter: Payer: Self-pay | Admitting: Cardiology

## 2013-05-30 ENCOUNTER — Ambulatory Visit (INDEPENDENT_AMBULATORY_CARE_PROVIDER_SITE_OTHER): Payer: 59 | Admitting: Cardiology

## 2013-05-30 VITALS — BP 122/80 | HR 60 | Ht 73.0 in | Wt 221.0 lb

## 2013-05-30 DIAGNOSIS — I10 Essential (primary) hypertension: Secondary | ICD-10-CM

## 2013-05-30 DIAGNOSIS — I4891 Unspecified atrial fibrillation: Secondary | ICD-10-CM

## 2013-05-30 DIAGNOSIS — Z8679 Personal history of other diseases of the circulatory system: Secondary | ICD-10-CM

## 2013-05-30 MED ORDER — FLECAINIDE ACETATE 50 MG PO TABS: 50.0000 mg | ORAL_TABLET | Freq: Two times a day (BID) | ORAL | Status: DC

## 2013-05-30 MED ORDER — METOPROLOL TARTRATE 50 MG PO TABS: 25.0000 mg | ORAL_TABLET | Freq: Two times a day (BID) | ORAL | Status: DC
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
## 2013-05-30 NOTE — Patient Instructions (Addendum)
Your physician wants you to follow-up in: ONE YEAR WITH DR Jens Som IN [place] You will receive a reminder letter in the mail two months in advance. If you don't receive a letter, please call our office to schedule the follow-up appointment.   Your physician has requested that you have an echocardiogram. Echocardiography is a painless test that uses sound waves to create images of your heart. It provides your doctor with information about the size and shape of your heart and how well your heart's chambers and valves are working. This procedure takes approximately one hour. There are no restrictions for this procedure.   DECREASE METOPROLOL TO 25 MG (ONE HALF 50 MG TABLET) TWICE DAILY

## 2013-05-30 NOTE — Assessment & Plan Note (Signed)
Patient remains in sinus rhythm. His bouts of atrial fibrillation are relatively infrequent. Continue present dose of flecainide. He is describing occasional fatigue with low heart rates. Decrease metoprolol to 25 mg by mouth twice a day. Repeat echocardiogram. He has no embolic risk factors. Continue aspirin. We discussed anticoagulation issues during this visit.

## 2013-06-04 HISTORY — PX: TRANSTHORACIC ECHOCARDIOGRAM: SHX275

## 2013-06-12 ENCOUNTER — Ambulatory Visit (HOSPITAL_COMMUNITY): Payer: 59 | Attending: Cardiology

## 2013-06-12 DIAGNOSIS — I059 Rheumatic mitral valve disease, unspecified: Secondary | ICD-10-CM | POA: Insufficient documentation

## 2013-06-12 DIAGNOSIS — I4891 Unspecified atrial fibrillation: Secondary | ICD-10-CM

## 2013-06-12 DIAGNOSIS — I079 Rheumatic tricuspid valve disease, unspecified: Secondary | ICD-10-CM | POA: Insufficient documentation

## 2013-06-12 DIAGNOSIS — R002 Palpitations: Secondary | ICD-10-CM | POA: Insufficient documentation

## 2013-06-12 DIAGNOSIS — I10 Essential (primary) hypertension: Secondary | ICD-10-CM | POA: Insufficient documentation

## 2013-06-12 NOTE — Progress Notes (Signed)
Echocardiogram performed.  

## 2013-11-09 ENCOUNTER — Other Ambulatory Visit: Payer: Self-pay | Admitting: *Deleted

## 2013-11-09 DIAGNOSIS — Z8679 Personal history of other diseases of the circulatory system: Secondary | ICD-10-CM

## 2013-11-09 DIAGNOSIS — I10 Essential (primary) hypertension: Secondary | ICD-10-CM

## 2013-11-13 ENCOUNTER — Other Ambulatory Visit: Payer: Self-pay

## 2013-11-13 DIAGNOSIS — Z8679 Personal history of other diseases of the circulatory system: Secondary | ICD-10-CM

## 2013-11-13 DIAGNOSIS — I10 Essential (primary) hypertension: Secondary | ICD-10-CM

## 2013-11-13 MED ORDER — METOPROLOL TARTRATE 50 MG PO TABS: 25.0000 mg | ORAL_TABLET | Freq: Two times a day (BID) | ORAL | Status: DC

## 2013-12-06 ENCOUNTER — Ambulatory Visit: Payer: 59 | Admitting: *Deleted

## 2013-12-06 DIAGNOSIS — Z8679 Personal history of other diseases of the circulatory system: Secondary | ICD-10-CM

## 2013-12-06 NOTE — Progress Notes (Signed)
Patient had a visit in the [place] Office with Dr.Crenshaw on 05/30/2013 and the patient states that his EKG was lost so he is here today for a 12 lead EKG for insurance purposes. States he has had no problems and believes that he has stayed in sinus rhythm. EKG today shows normal sinus rhythm / heart rate 60. Reviewed and signed by Dr.Crenshaw. Patient took a copy of EKG with him and the other will be scanned in the system.

## 2013-12-20 ENCOUNTER — Other Ambulatory Visit: Payer: Self-pay | Admitting: Family Medicine

## 2013-12-20 DIAGNOSIS — I10 Essential (primary) hypertension: Secondary | ICD-10-CM

## 2013-12-20 DIAGNOSIS — Z8679 Personal history of other diseases of the circulatory system: Secondary | ICD-10-CM

## 2013-12-20 NOTE — Telephone Encounter (Signed)
Medication denied.  [facility] and then referred to cardiology.  Patient never kept follow up.  Please call patient to schedule appointment.

## 2013-12-21 NOTE — Telephone Encounter (Signed)
LM for patient to CB to schedule an appt for Rx refill

## 2014-01-21 ENCOUNTER — Other Ambulatory Visit: Payer: Self-pay

## 2014-01-21 DIAGNOSIS — I10 Essential (primary) hypertension: Secondary | ICD-10-CM

## 2014-01-21 DIAGNOSIS — Z8679 Personal history of other diseases of the circulatory system: Secondary | ICD-10-CM

## 2014-01-21 MED ORDER — METOPROLOL TARTRATE 50 MG PO TABS: 25.0000 mg | ORAL_TABLET | Freq: Two times a day (BID) | ORAL | Status: DC

## 2014-06-27 ENCOUNTER — Telehealth: Payer: Self-pay

## 2014-06-27 DIAGNOSIS — Z8679 Personal history of other diseases of the circulatory system: Secondary | ICD-10-CM

## 2014-06-27 DIAGNOSIS — I10 Essential (primary) hypertension: Secondary | ICD-10-CM

## 2014-07-02 ENCOUNTER — Other Ambulatory Visit: Payer: Self-pay

## 2014-07-02 MED ORDER — FLECAINIDE ACETATE 50 MG PO TABS: 50.0000 mg | ORAL_TABLET | Freq: Two times a day (BID) | ORAL | Status: DC

## 2014-07-02 MED ORDER — METOPROLOL TARTRATE 50 MG PO TABS: 25.0000 mg | ORAL_TABLET | Freq: Two times a day (BID) | ORAL | Status: DC

## 2014-07-02 NOTE — Telephone Encounter (Signed)
Sent Rx's in.

## 2014-07-17 ENCOUNTER — Other Ambulatory Visit (INDEPENDENT_AMBULATORY_CARE_PROVIDER_SITE_OTHER): Payer: 59

## 2014-07-17 DIAGNOSIS — I10 Essential (primary) hypertension: Secondary | ICD-10-CM

## 2014-07-17 DIAGNOSIS — Z0000 Encounter for general adult medical examination without abnormal findings: Secondary | ICD-10-CM

## 2014-07-17 LAB — CBC WITH DIFFERENTIAL/PLATELET
BASOS PCT: 0.7 % (ref 0.0–3.0)
Basophils Absolute: 0 10*3/uL (ref 0.0–0.1)
EOS ABS: 0.1 10*3/uL (ref 0.0–0.7)
Eosinophils Relative: 1 % (ref 0.0–5.0)
HCT: 48.5 % (ref 39.0–52.0)
Hemoglobin: 16.2 g/dL (ref 13.0–17.0)
Lymphocytes Relative: 24 % (ref 12.0–46.0)
Lymphs Abs: 1.5 10*3/uL (ref 0.7–4.0)
MCHC: 33.5 g/dL (ref 30.0–36.0)
MCV: 89.2 fl (ref 78.0–100.0)
MONOS PCT: 10 % (ref 3.0–12.0)
Monocytes Absolute: 0.6 10*3/uL (ref 0.1–1.0)
Neutro Abs: 4.1 10*3/uL (ref 1.4–7.7)
Neutrophils Relative %: 64.3 % (ref 43.0–77.0)
Platelets: 223 10*3/uL (ref 150.0–400.0)
RBC: 5.43 Mil/uL (ref 4.22–5.81)
RDW: 13.1 % (ref 11.5–15.5)
WBC: 6.4 10*3/uL (ref 4.0–10.5)

## 2014-07-18 LAB — PSA: PSA: 0.65 ng/mL (ref 0.10–4.00)

## 2014-07-18 LAB — COMPREHENSIVE METABOLIC PANEL
ALK PHOS: 66 U/L (ref 39–117)
ALT: 58 U/L — ABNORMAL HIGH (ref 0–53)
AST: 37 U/L (ref 0–37)
Albumin: 4 g/dL (ref 3.5–5.2)
BUN: 17 mg/dL (ref 6–23)
CO2: 30 mEq/L (ref 19–32)
Calcium: 10 mg/dL (ref 8.4–10.5)
Chloride: 103 mEq/L (ref 96–112)
Creatinine, Ser: 1 mg/dL (ref 0.4–1.5)
GFR: 81.01 mL/min (ref >60.00)
Glucose, Bld: 102 mg/dL — ABNORMAL HIGH (ref 70–99)
Potassium: 5 mEq/L (ref 3.5–5.1)
SODIUM: 138 meq/L (ref 135–145)
TOTAL PROTEIN: 7.5 g/dL (ref 6.0–8.3)
Total Bilirubin: 0.6 mg/dL (ref 0.2–1.2)

## 2014-07-18 LAB — LIPID PANEL
CHOL/HDL RATIO: 7
Cholesterol: 233 mg/dL — ABNORMAL HIGH (ref 0–200)
HDL: 31.8 mg/dL — AB (ref >39.00)
NonHDL: 201.2
Triglycerides: 491 mg/dL — ABNORMAL HIGH (ref 0.0–149.0)
VLDL: 98.2 mg/dL — AB (ref 0.0–40.0)

## 2014-07-18 LAB — LDL CHOLESTEROL, DIRECT: LDL DIRECT: 113.8 mg/dL

## 2014-07-18 LAB — TSH: TSH: 0.97 u[IU]/mL (ref 0.35–4.50)

## 2014-07-22 ENCOUNTER — Ambulatory Visit (INDEPENDENT_AMBULATORY_CARE_PROVIDER_SITE_OTHER): Payer: 59 | Admitting: Family Medicine

## 2014-07-22 ENCOUNTER — Encounter: Payer: Self-pay | Admitting: Family Medicine

## 2014-07-22 VITALS — BP 136/88 | HR 64 | Temp 98.2°F | Resp 18 | Ht 73.0 in | Wt 222.0 lb

## 2014-07-22 DIAGNOSIS — Z0000 Encounter for general adult medical examination without abnormal findings: Secondary | ICD-10-CM

## 2014-07-22 DIAGNOSIS — E8881 Metabolic syndrome: Secondary | ICD-10-CM

## 2014-07-22 DIAGNOSIS — Z87438 Personal history of other diseases of male genital organs: Secondary | ICD-10-CM

## 2014-07-22 DIAGNOSIS — Z8679 Personal history of other diseases of the circulatory system: Secondary | ICD-10-CM

## 2014-07-22 DIAGNOSIS — I4891 Unspecified atrial fibrillation: Secondary | ICD-10-CM

## 2014-07-22 DIAGNOSIS — Z23 Encounter for immunization: Secondary | ICD-10-CM

## 2014-07-22 DIAGNOSIS — I10 Essential (primary) hypertension: Secondary | ICD-10-CM

## 2014-07-22 MED ORDER — METOPROLOL TARTRATE 50 MG PO TABS: 25.0000 mg | ORAL_TABLET | Freq: Two times a day (BID) | ORAL | Status: DC

## 2014-07-22 MED ORDER — FLECAINIDE ACETATE 50 MG PO TABS: 50.0000 mg | ORAL_TABLET | Freq: Two times a day (BID) | ORAL | Status: DC

## 2014-07-22 MED ORDER — CIPROFLOXACIN HCL 500 MG PO TABS: 500.0000 mg | ORAL_TABLET | Freq: Two times a day (BID) | ORAL | Status: DC

## 2014-07-22 NOTE — Progress Notes (Signed)
Pre visit review using our clinic review tool, if applicable. No additional management support is needed unless otherwise documented below in the visit note. 

## 2014-07-22 NOTE — Assessment & Plan Note (Signed)
Historically well controlled but not optimally monitored. Severe LVH on recent ECHO. Encouraged pt to buy automatic bp cuff with upper arm cuff for home monitoring and he said he would do this so we can try to be sure we are not missing poorly controlled HTN.

## 2014-07-22 NOTE — Assessment & Plan Note (Signed)
Stable on current medical regimen but pt requests referral to EP MD for further evaluation and management. [facility] today. RF'd pt's meds today.

## 2014-07-22 NOTE — Assessment & Plan Note (Signed)
IFG, elevated trigs and low HDL, waist circ 42 inches. Granted, his labs most recently were not done while STRICTLY fasting. Nevertheless, he needs to start aggressive therapeutic lifestyle changes.  He REALLY wants to avoid meds if at all possible. He has agreed to meet with our FNP who specializes in dietary/lifestyle change counseling Shawn Morales(Shawn Morales) ASAP. Plan on repeat FLP in 6 mo.

## 2014-07-22 NOTE — Assessment & Plan Note (Signed)
Since pt gets this on recurrent basis and is often out of town when it occurs, will give rx for him to fill and have this med on hand when he gets his classic symptoms. If any new/difference in sx's, then he is to seek medical care as appropriate.

## 2014-07-22 NOTE — Assessment & Plan Note (Signed)
Reviewed age and gender appropriate health maintenance issues (prudent diet, regular exercise, health risks of tobacco and excessive alcohol, use of seatbelts, fire alarms in home, use of sunscreen).  Also reviewed age and gender appropriate health screening as well as vaccine recommendations. Reviewed health panel labs in detail with pt today. DRE normal, PSA normal. Colon cancer screening UTD. Flu vaccine IM today.
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
## 2014-07-23 ENCOUNTER — Telehealth: Payer: Self-pay | Admitting: Family Medicine

## 2014-07-23 NOTE — Telephone Encounter (Signed)
emmi emaiiled

## 2014-07-25 ENCOUNTER — Encounter: Payer: Self-pay | Admitting: Nurse Practitioner

## 2014-07-25 ENCOUNTER — Ambulatory Visit (INDEPENDENT_AMBULATORY_CARE_PROVIDER_SITE_OTHER): Payer: 59 | Admitting: Nurse Practitioner

## 2014-07-25 VITALS — BP 133/79 | HR 64 | Temp 97.4°F | Ht 73.0 in | Wt 222.0 lb

## 2014-07-25 DIAGNOSIS — Z6829 Body mass index (BMI) 29.0-29.9, adult: Secondary | ICD-10-CM

## 2014-07-25 DIAGNOSIS — E781 Pure hyperglyceridemia: Secondary | ICD-10-CM

## 2014-07-25 NOTE — Progress Notes (Signed)
Subjective:     Shawn Morales is a 58 y.o. male returns for weight loss management & nutrition counseling for hypertriglyceridemia. BMI is 29. Goal for weight loss is 25 lbs to normalize BMI & reduce risk for chronic disease.  He is currently treated for arrythmia with flecainide & metoprolol. In spite of these meds, his BP is occasionally slightly elevated. Recent lipid panel reveals elevated triglycerides and low HDL. He is not exercising and often eats fast food. He consumes meat 2 to 3 times daily and occasionally eats fruits & vegetables. He drinks diet soda & water. He is interested in improving nutrition for best health.    The following portions of the patient's history were reviewed and updated as appropriate: allergies, current medications, past medical history, past social history, past surgical history and problem list.  Review of Systems Pertinent items are noted in HPI.    Objective:    BP 133/79  Pulse 64  Temp(Src) 97.4 F (36.3 C) (Temporal)  Ht 6\' 1"  (1.854 m)  Wt 222 lb (100.699 kg)  BMI 29.30 kg/m2  SpO2 94% BP 133/79  Pulse 64  Temp(Src) 97.4 F (36.3 C) (Temporal)  Ht 6\' 1"  (1.854 m)  Wt 222 lb (100.699 kg)  BMI 29.30 kg/m2  SpO2 94% General appearance: alert, cooperative, appears stated age and no distress Head: Normocephalic, without obvious abnormality, atraumatic Eyes: negative findings: lids and lashes normal and conjunctivae and sclerae normal Abdomen: waist circumference is over 45 iinches    Assessment:     1. BMI 29.0-29.9,adult Goal for weight loss is 25 lbs. Food changes recommended-HO given Daily exercise 30 minutes F/u 1 mo.  Spent 20 minutes of visit educating on best food choices.  2. Hypertriglyceridemia Cut out refined sugars & flours Use HO to guide food choices. Repeat lipids in 3 mos.

## 2014-07-25 NOTE — Progress Notes (Signed)
Pre visit review using our clinic review tool, if applicable. No additional management support is needed unless otherwise documented below in the visit note. 

## 2014-07-25 NOTE — Patient Instructions (Signed)
Read Eat to Live by Monico HoarJoel Fuhrman.  Refer to HO for principles of healthy diet, food plan, & grocery list. Begin implementing principles.  Exercise 30 minutes every day for best health.  Return in 1 mo. For weight check Lipids will be checked again in 3 mos.

## 2014-08-01 ENCOUNTER — Encounter: Payer: Self-pay | Admitting: Internal Medicine

## 2014-08-01 ENCOUNTER — Ambulatory Visit (INDEPENDENT_AMBULATORY_CARE_PROVIDER_SITE_OTHER): Payer: 59 | Admitting: Internal Medicine

## 2014-08-01 VITALS — BP 122/70 | HR 58 | Ht 73.0 in | Wt 220.0 lb

## 2014-08-01 DIAGNOSIS — I10 Essential (primary) hypertension: Secondary | ICD-10-CM

## 2014-08-01 DIAGNOSIS — I4891 Unspecified atrial fibrillation: Secondary | ICD-10-CM

## 2014-08-01 NOTE — Patient Instructions (Signed)
Your physician wants you to follow-up in 6 months with Dr Stevan BornaylorYou will receive a reminder letter in the mail two months in advance. If you don't receive a letter, please call our office to schedule the follow-up appointment.    Your physician has recommended you make the following change in your medication:  1) Wean off Metoprolol  1/2 tablet twoce daily for 2 weeks then go to 1/2 tablet daily for a week then stop

## 2014-08-01 NOTE — Assessment & Plan Note (Signed)
He is maintaining sinus rhythm very nicely on low-dose flecainide. Because of fatigue, he has requested to stop taking the metoprolol. I think this would be reasonable. If he feels no different on the metoprolol, he is instructed to start back. If he has recurring atrial fibrillation as a consequence of stopping his beta blocker, we would consider trying a different blocker.
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
## 2014-08-01 NOTE — Assessment & Plan Note (Signed)
The patient states that he does not have hypertension. I've asked the patient to follow his blood pressures. He will maintain a low-sodium diet.

## 2014-08-28 ENCOUNTER — Other Ambulatory Visit: Payer: Self-pay | Admitting: Family Medicine

## 2014-08-28 ENCOUNTER — Other Ambulatory Visit: Payer: 59

## 2014-08-28 DIAGNOSIS — E785 Hyperlipidemia, unspecified: Secondary | ICD-10-CM

## 2014-08-28 LAB — LIPID PANEL
CHOLESTEROL: 201 mg/dL — AB (ref 0–200)
HDL: 38 mg/dL — AB (ref >39)
LDL Cholesterol: 134 mg/dL — ABNORMAL HIGH (ref 0–99)
Total CHOL/HDL Ratio: 5.3 Ratio
Triglycerides: 146 mg/dL (ref <150)
VLDL: 29 mg/dL (ref 0–40)

## 2014-09-04 ENCOUNTER — Telehealth: Payer: Self-pay | Admitting: Nurse Practitioner

## 2014-09-04 NOTE — Telephone Encounter (Signed)
Spoke with pt, advised lab results. Pt understood and will call back to make an appt.

## 2014-09-04 NOTE — Telephone Encounter (Signed)
pls call pt: Advise Cholesterol panel is much improved!!! Triglycerides went from 491 to 146; good chol improved from 31 to 38-higher the better;  & bad chol decreased from 201 to 134. This lowers his risk for vessel disease from 12 % to 7 %.  Keep up great work with diet changes!  I'd  Like to see him for OV for wt check & ongoing diet management within next 2-3 weeks.

## 2014-09-17 ENCOUNTER — Telehealth: Payer: Self-pay | Admitting: Family Medicine

## 2014-09-17 NOTE — Telephone Encounter (Signed)
[facility]  I don't see if on his current med list.  Should he be taking metoprolol 50mg  0.5 tabs QD?  Please advise.

## 2014-09-19 MED ORDER — METOPROLOL TARTRATE 50 MG PO TABS: ORAL_TABLET | ORAL | Status: DC

## 2014-09-19 NOTE — Telephone Encounter (Signed)
Metoprolol sent to pharmacy as per pt request.

## 2015-02-04 IMAGING — CR DG HAND COMPLETE 3+V*R*
3 series · 3 of 3 positions shown · non-contrast
Comparison: None

CLINICAL DATA: Fell.  Persistent hand pain.

RIGHT HAND - COMPLETE 3+ VIEW

[PA]
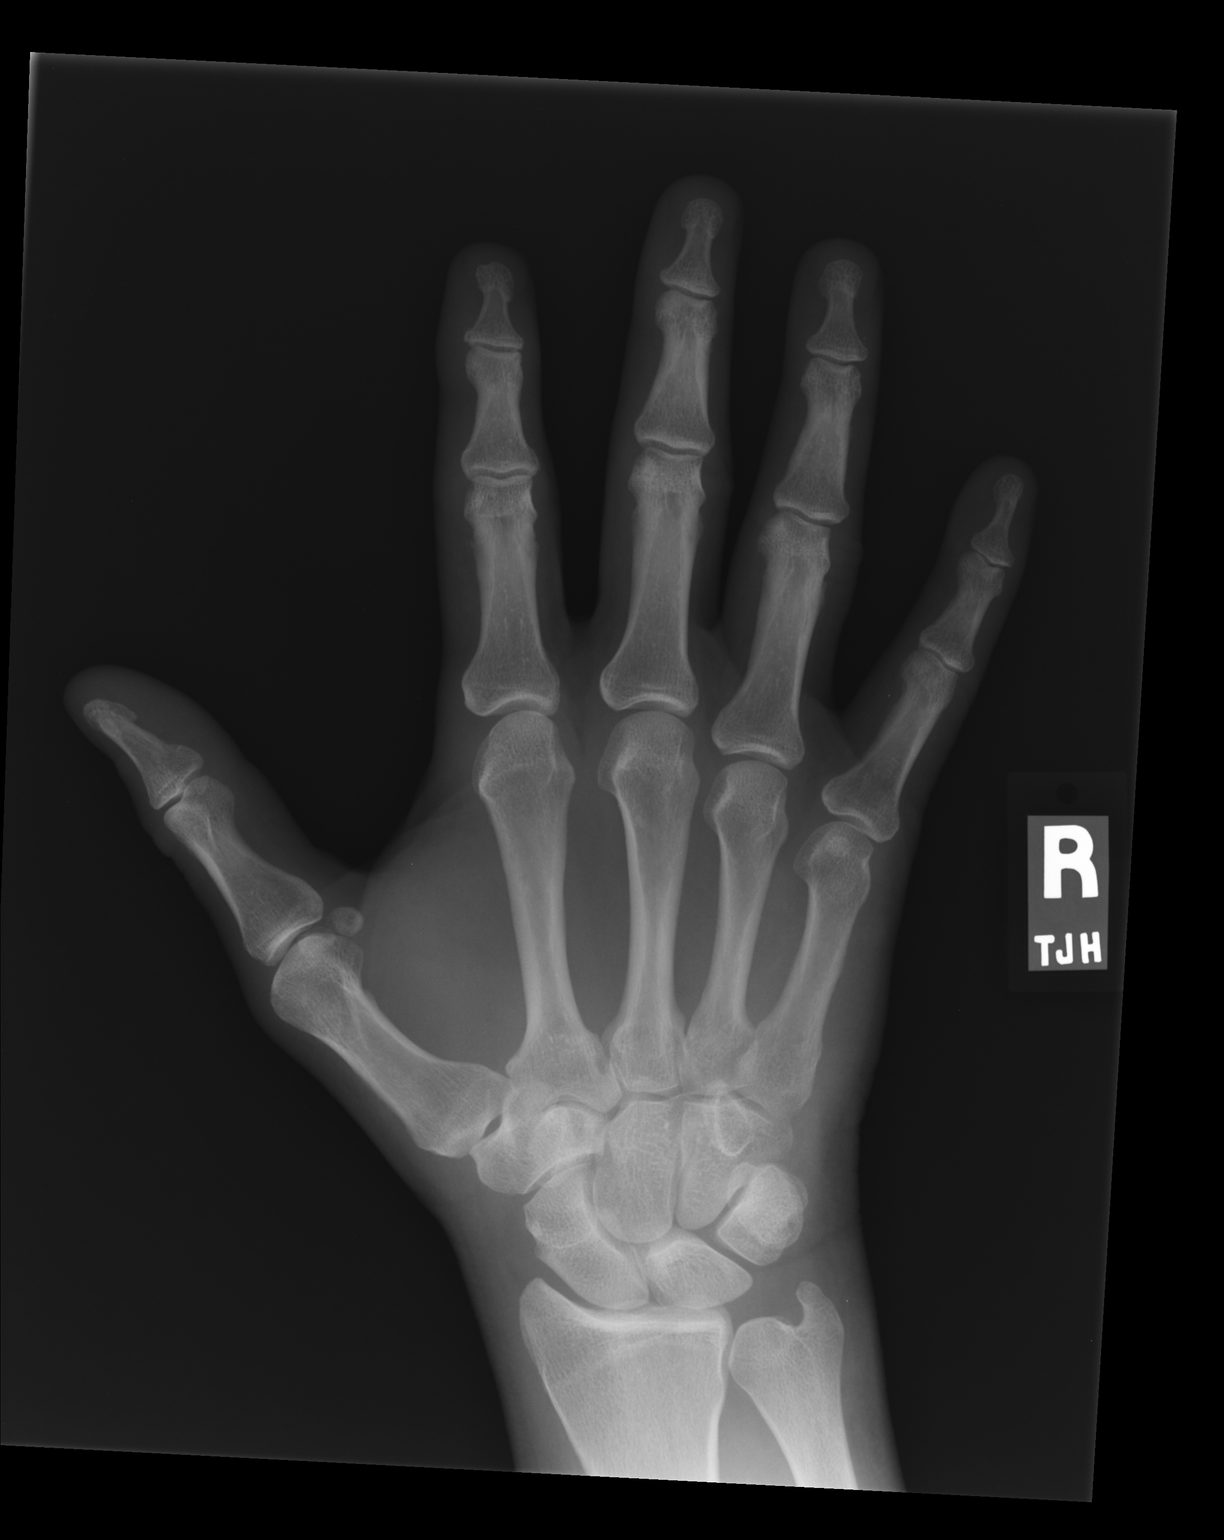

[lateral]
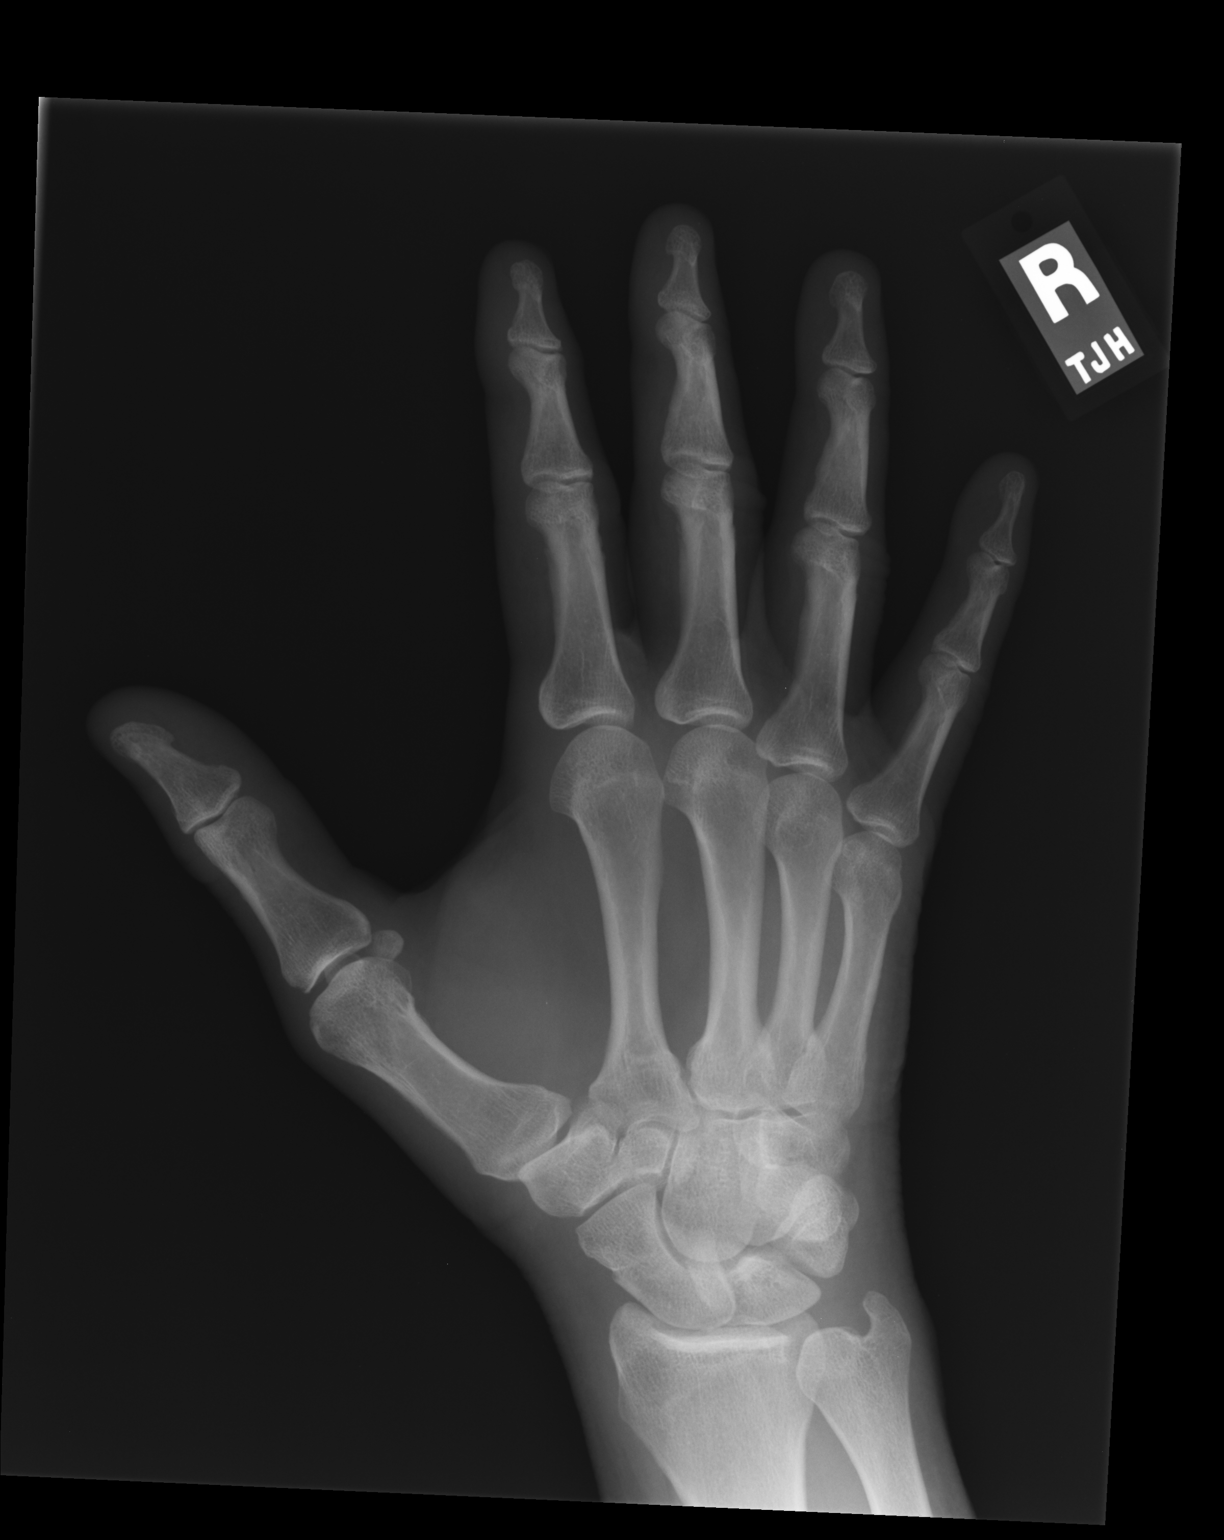

[pa obl]
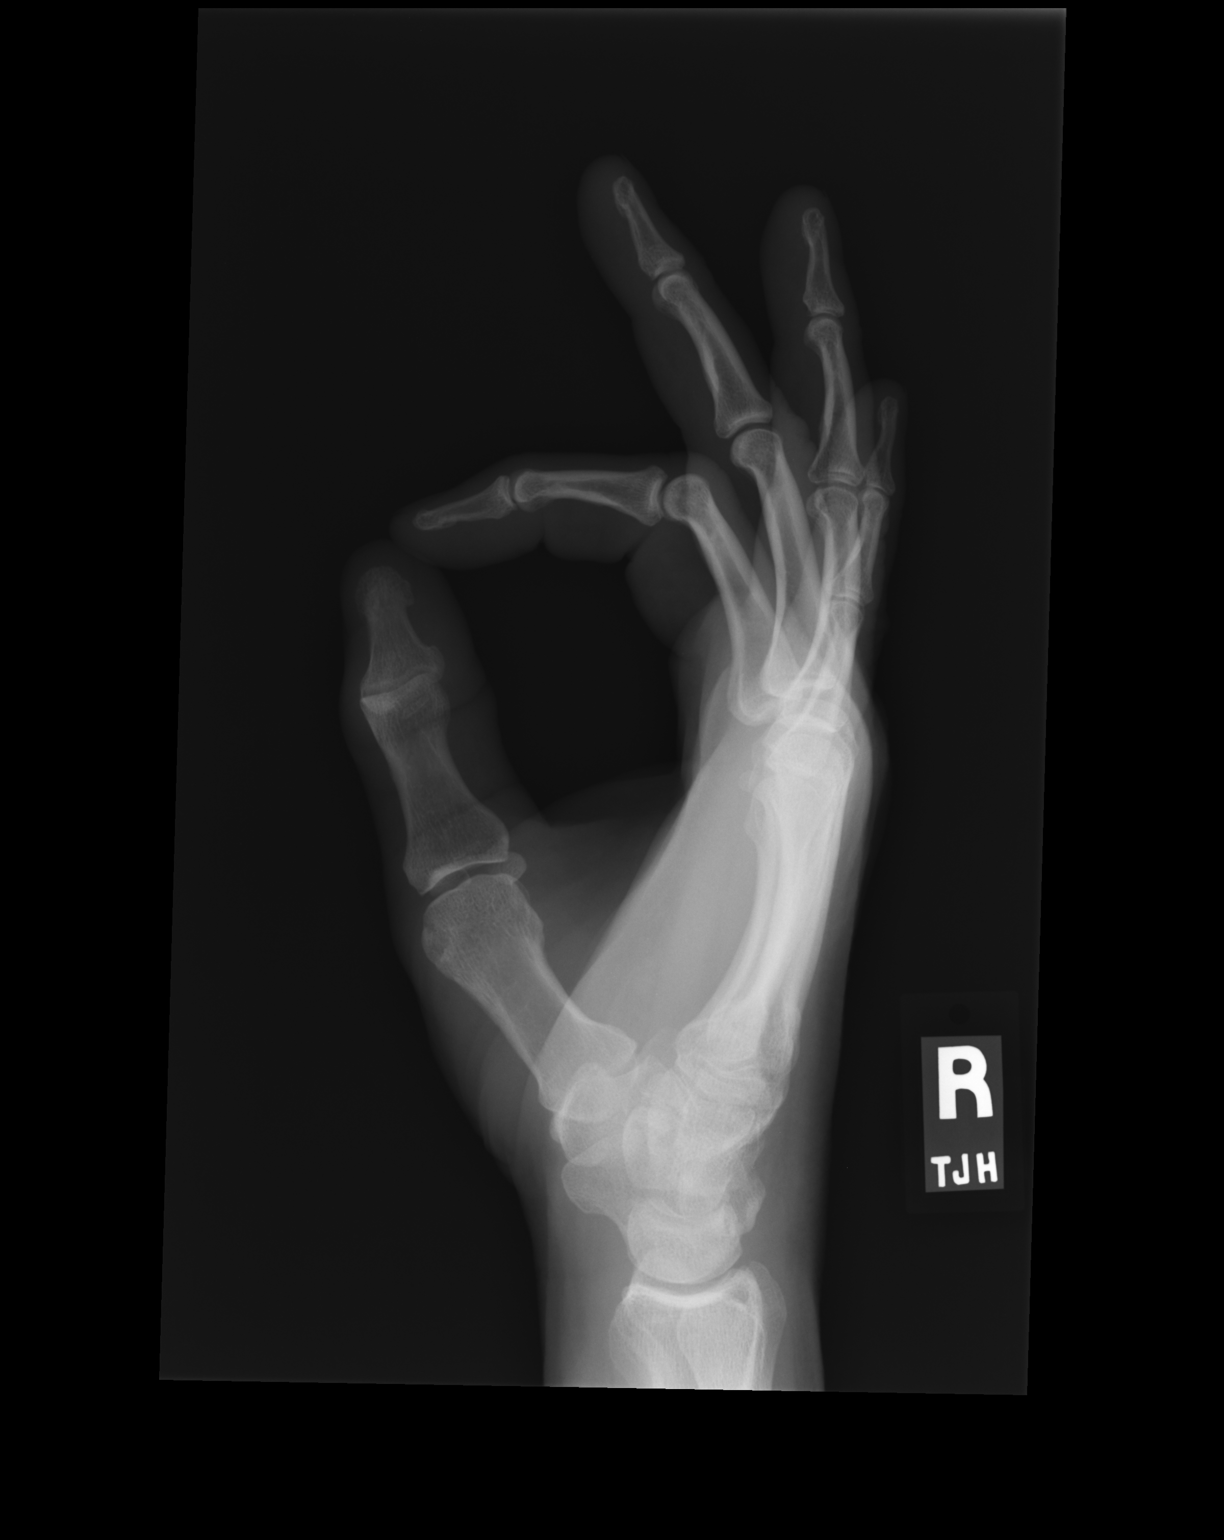

[3 of 3 positions shown; findings below may reference images not displayed]

FINDINGS: The joint spaces are maintained.  No acute fracture.
IMPRESSION: No acute fracture.

## 2015-02-12 ENCOUNTER — Telehealth: Payer: Self-pay | Admitting: Family Medicine

## 2015-02-12 NOTE — Telephone Encounter (Signed)
Pt is in need of a refill on his flexainide. He would like enough too get him through the next 3 mns.  as his insurance runs out May 21.

## 2015-02-12 NOTE — Telephone Encounter (Signed)
Left message for Shawn Morales to call back. I can send in a 30 day supply but Shawn Morales will need to shcedule ov for f/u with Dr. Milinda CaveMcGowen he has not been seen since 07/25/14. Also need to know what pharmacy he would like this to be sent to.

## 2015-02-13 NOTE — Telephone Encounter (Signed)
Pt advised and voiced understanding. Call forward to Darcy to schedule apt.

## 2015-02-21 ENCOUNTER — Ambulatory Visit (INDEPENDENT_AMBULATORY_CARE_PROVIDER_SITE_OTHER): Payer: 59 | Admitting: Family Medicine

## 2015-02-21 ENCOUNTER — Encounter: Payer: Self-pay | Admitting: Family Medicine

## 2015-02-21 VITALS — BP 130/80 | HR 64 | Temp 97.9°F | Resp 16 | Wt 204.0 lb

## 2015-02-21 DIAGNOSIS — Z87438 Personal history of other diseases of male genital organs: Secondary | ICD-10-CM

## 2015-02-21 DIAGNOSIS — Z8679 Personal history of other diseases of the circulatory system: Secondary | ICD-10-CM | POA: Diagnosis not present

## 2015-02-21 DIAGNOSIS — E8881 Metabolic syndrome: Secondary | ICD-10-CM | POA: Diagnosis not present

## 2015-02-21 LAB — COMPREHENSIVE METABOLIC PANEL
ALK PHOS: 78 U/L (ref 39–117)
ALT: 24 U/L (ref 0–53)
AST: 22 U/L (ref 0–37)
Albumin: 4.7 g/dL (ref 3.5–5.2)
BILIRUBIN TOTAL: 0.6 mg/dL (ref 0.2–1.2)
BUN: 16 mg/dL (ref 6–23)
CO2: 30 mEq/L (ref 19–32)
Calcium: 9.7 mg/dL (ref 8.4–10.5)
Chloride: 103 mEq/L (ref 96–112)
Creatinine, Ser: 0.86 mg/dL (ref 0.40–1.50)
GFR: 97.32 mL/min (ref >60.00)
Glucose, Bld: 108 mg/dL — ABNORMAL HIGH (ref 70–99)
Potassium: 4.9 mEq/L (ref 3.5–5.1)
Sodium: 139 mEq/L (ref 135–145)
TOTAL PROTEIN: 7.3 g/dL (ref 6.0–8.3)

## 2015-02-21 LAB — LIPID PANEL
CHOLESTEROL: 210 mg/dL — AB (ref 0–200)
HDL: 41.5 mg/dL (ref >39.00)
LDL Cholesterol: 134 mg/dL — ABNORMAL HIGH (ref 0–99)
NONHDL: 168.5
Total CHOL/HDL Ratio: 5
Triglycerides: 171 mg/dL — ABNORMAL HIGH (ref 0.0–149.0)
VLDL: 34.2 mg/dL (ref 0.0–40.0)

## 2015-02-21 LAB — HEMOGLOBIN A1C: Hgb A1c MFr Bld: 5.6 % (ref 4.6–6.5)

## 2015-02-21 MED ORDER — CIPROFLOXACIN HCL 500 MG PO TABS: 500.0000 mg | ORAL_TABLET | Freq: Two times a day (BID) | ORAL | Status: DC

## 2015-02-21 MED ORDER — OXYCODONE HCL 5 MG PO TABS: ORAL_TABLET | ORAL | Status: DC

## 2015-02-21 MED ORDER — FLECAINIDE ACETATE 50 MG PO TABS: 50.0000 mg | ORAL_TABLET | Freq: Two times a day (BID) | ORAL | Status: DC

## 2015-02-21 NOTE — Progress Notes (Signed)
OFFICE NOTE  02/21/2015  CC:  Chief Complaint  Patient presents with  . Follow-up    Pt is fasting     HPI: Patient is a 59 y.o. Caucasian male who is here for 6 mo f/u metabolic syndrome, hx of recurrent epididymitis, and history of atrial fibrillation and has maintained sinus rhythm well on this med --currently taking flecainide, viagra, fish oil, and MVI qd.  Got off lopressor after last visit with heart MD (fatigue): feels a bit better.  Has maintaned sinus rhythm. Off ASA as well, ok with cards b/c low stroke risk, also was having a little ringing in ears and has not noted change in that since getting off aspirin. Changed his diet quite a bit since last visit, doing a lot more exercise (walking, crunches, etc) and has lost some weight since last visit-this was his plan when I mentioned possibility of getting on statin.   He is completely fasting today.  Insurance ends tomorrow and he'll be off ins for 3 mo, needs RF of flecainide and cipro (prn use for recurrent epididymitis).  Also asks for a few oxycodone to have on hand when he gets epididymitis in the future b/c of severe pain while waiting for the cipro to work.    Pertinent PMH:  Past medical, surgical, social, and family history reviewed and no changes are noted since last office visit.  MEDS:  Outpatient Prescriptions Prior to Visit  Medication Sig Dispense Refill  . Multiple Vitamin (MULTIVITAMIN) tablet Take 1 tablet by mouth daily.    . Omega-3 Fatty Acids (FISH OIL) 1200 MG CAPS Take 1 capsule by mouth 3 (three) times daily.    . sildenafil (VIAGRA) 50 MG tablet 1-2 tabs po 30 min prior to intercourse.  Use only once per 24h. 10 tablet 6  . flecainide (TAMBOCOR) 50 MG tablet Take 1 tablet (50 mg total) by mouth 2 (two) times daily. 60 tablet 0  . aspirin EC 81 MG tablet Take 81 mg by mouth daily.    . ciprofloxacin (CIPRO) 500 MG tablet Take 1 tablet (500 mg total) by mouth 2 (two) times daily. (Patient not taking:  Reported on 02/21/2015) 20 tablet 2  . metoprolol (LOPRESSOR) 50 MG tablet 1/2 tab po bid (Patient not taking: Reported on 02/21/2015) 30 tablet 6   No facility-administered medications prior to visit.    PE: Blood pressure 130/80, pulse 64, temperature 97.9 F (36.6 C), temperature source Oral, resp. rate 16, weight 204 lb (92.534 kg), SpO2 96 %. Gen: Alert, well appearing.  Patient is oriented to person, place, time, and situation. CV: RRR, no m/r/g.   LUNGS: CTA bilat, nonlabored resps, good aeration in all lung fields. EXT: no clubbing, cyanosis, or edema.    Lab Results  Component Value Date   HGBA1C 5.8 07/31/2012   Lab Results  Component Value Date   CHOL 201* 08/28/2014   HDL 38* 08/28/2014   LDLCALC 134* 08/28/2014   LDLDIRECT 113.8 07/17/2014   TRIG 146 08/28/2014   CHOLHDL 5.3 08/28/2014     Chemistry      Component Value Date/Time   NA 138 07/17/2014 0905   K 5.0 07/17/2014 0905   CL 103 07/17/2014 0905   CO2 30 07/17/2014 0905   BUN 17 07/17/2014 0905   CREATININE 1.0 07/17/2014 0905      Component Value Date/Time   CALCIUM 10.0 07/17/2014 0905   ALKPHOS 66 07/17/2014 0905   AST 37 07/17/2014 0905   ALT 58*  07/17/2014 0905   BILITOT 0.6 07/17/2014 0905      IMPRESSION AND PLAN:  1) Metabolic syndrome: working harder on TLC and has lost 18 lbs since last o/v here. Recheck FLP, HbA1c, and CMET here today. Continue current hard work on TLC.  He reiterates that he prefers to avoid statin treatment if at all possible.  2) History of recurrent epididymitis: gave RF's of cipro to have on hand for prn treatment--he as at least 2 episodes per year and cipro always resolves it, although he has quite a bit of pain at first that he would like a few pain pills on hand for.  I did give rx for oxycodone 5mg , 1-2 tabs q6h prn, #30, no RF.  3) History of atrial fibrillation, maintaining NSR on low dose flecainide, no longer on metoprolol and feeling a little bit less  fatigued and feeling no dysrhythmia symptoms.   Dr. Ladona Ridgelaylor and pt talked about ASA per pt report today and they have decided he is ok to stay off of this at this time.  FOLLOW UP: 6 mo fasting cpe

## 2015-02-21 NOTE — Progress Notes (Signed)
Pre visit review using our clinic review tool, if applicable. No additional management support is needed unless otherwise documented below in the visit note. 

## 2016-03-05 DIAGNOSIS — M19011 Primary osteoarthritis, right shoulder: Secondary | ICD-10-CM | POA: Diagnosis not present

## 2016-03-05 DIAGNOSIS — M25511 Pain in right shoulder: Secondary | ICD-10-CM | POA: Diagnosis not present

## 2016-03-05 DIAGNOSIS — M7541 Impingement syndrome of right shoulder: Secondary | ICD-10-CM | POA: Diagnosis not present

## 2016-04-12 ENCOUNTER — Telehealth: Payer: Self-pay | Admitting: Internal Medicine

## 2016-05-13 ENCOUNTER — Telehealth: Payer: Self-pay | Admitting: Family Medicine

## 2016-05-13 DIAGNOSIS — Z8679 Personal history of other diseases of the circulatory system: Secondary | ICD-10-CM

## 2016-05-13 NOTE — Telephone Encounter (Signed)
Pt would like a refill on his flecainide please. He is scheduled for a 06/04/16 CPE.

## 2016-05-14 MED ORDER — FLECAINIDE ACETATE 50 MG PO TABS: 50.0000 mg | ORAL_TABLET | Freq: Two times a day (BID) | ORAL | 0 refills | Status: DC

## 2016-05-14 NOTE — Telephone Encounter (Signed)
RF request for flecainide LOV: 02/21/15 Next ov: 06/04/16 Last written: 02/21/15 #180 w/ 3RF  [facility] #[facility]. Pt advised and voiced understanding.

## 2016-06-04 ENCOUNTER — Ambulatory Visit (INDEPENDENT_AMBULATORY_CARE_PROVIDER_SITE_OTHER): Payer: BLUE CROSS/BLUE SHIELD | Admitting: Family Medicine

## 2016-06-04 ENCOUNTER — Encounter: Payer: Self-pay | Admitting: Family Medicine

## 2016-06-04 VITALS — BP 122/83 | HR 74 | Temp 97.9°F | Resp 16 | Ht 73.0 in | Wt 208.8 lb

## 2016-06-04 DIAGNOSIS — E8881 Metabolic syndrome: Secondary | ICD-10-CM | POA: Diagnosis not present

## 2016-06-04 DIAGNOSIS — Z125 Encounter for screening for malignant neoplasm of prostate: Secondary | ICD-10-CM | POA: Diagnosis not present

## 2016-06-04 DIAGNOSIS — Z0000 Encounter for general adult medical examination without abnormal findings: Secondary | ICD-10-CM

## 2016-06-04 DIAGNOSIS — E785 Hyperlipidemia, unspecified: Secondary | ICD-10-CM

## 2016-06-04 HISTORY — DX: Hyperlipidemia, unspecified: E78.5

## 2016-06-04 LAB — COMPREHENSIVE METABOLIC PANEL
ALBUMIN: 4.8 g/dL (ref 3.5–5.2)
ALK PHOS: 59 U/L (ref 39–117)
ALT: 62 U/L — ABNORMAL HIGH (ref 0–53)
AST: 34 U/L (ref 0–37)
BUN: 17 mg/dL (ref 6–23)
CALCIUM: 9.4 mg/dL (ref 8.4–10.5)
CO2: 30 mEq/L (ref 19–32)
Chloride: 104 mEq/L (ref 96–112)
Creatinine, Ser: 1.03 mg/dL (ref 0.40–1.50)
GFR: 78.68 mL/min (ref >60.00)
Glucose, Bld: 100 mg/dL — ABNORMAL HIGH (ref 70–99)
POTASSIUM: 4.6 meq/L (ref 3.5–5.1)
Sodium: 141 mEq/L (ref 135–145)
TOTAL PROTEIN: 7.3 g/dL (ref 6.0–8.3)
Total Bilirubin: 0.9 mg/dL (ref 0.2–1.2)

## 2016-06-04 LAB — CBC WITH DIFFERENTIAL/PLATELET
Basophils Absolute: 0 10*3/uL (ref 0.0–0.1)
Basophils Relative: 0.5 % (ref 0.0–3.0)
EOS PCT: 1.5 % (ref 0.0–5.0)
Eosinophils Absolute: 0.1 10*3/uL (ref 0.0–0.7)
HCT: 49.7 % (ref 39.0–52.0)
HEMOGLOBIN: 17 g/dL (ref 13.0–17.0)
Lymphocytes Relative: 22.6 % (ref 12.0–46.0)
Lymphs Abs: 1.4 10*3/uL (ref 0.7–4.0)
MCHC: 34.2 g/dL (ref 30.0–36.0)
MCV: 88.5 fl (ref 78.0–100.0)
MONO ABS: 0.6 10*3/uL (ref 0.1–1.0)
MONOS PCT: 10 % (ref 3.0–12.0)
Neutro Abs: 4.1 10*3/uL (ref 1.4–7.7)
Neutrophils Relative %: 65.4 % (ref 43.0–77.0)
Platelets: 224 10*3/uL (ref 150.0–400.0)
RBC: 5.61 Mil/uL (ref 4.22–5.81)
RDW: 12.8 % (ref 11.5–15.5)
WBC: 6.3 10*3/uL (ref 4.0–10.5)

## 2016-06-04 LAB — LIPID PANEL
CHOLESTEROL: 239 mg/dL — AB (ref 0–200)
HDL: 40.4 mg/dL (ref >39.00)
LDL Cholesterol: 164 mg/dL — ABNORMAL HIGH (ref 0–99)
NonHDL: 198.84
TRIGLYCERIDES: 172 mg/dL — AB (ref 0.0–149.0)
Total CHOL/HDL Ratio: 6
VLDL: 34.4 mg/dL (ref 0.0–40.0)

## 2016-06-04 LAB — HEMOGLOBIN A1C: Hgb A1c MFr Bld: 5.8 % (ref 4.6–6.5)

## 2016-06-04 LAB — TSH: TSH: 0.75 u[IU]/mL (ref 0.35–4.50)

## 2016-06-04 LAB — PSA: PSA: 0.89 ng/mL (ref 0.10–4.00)
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
## 2016-06-08 ENCOUNTER — Encounter: Payer: Self-pay | Admitting: Family Medicine

## 2016-06-14 ENCOUNTER — Other Ambulatory Visit: Payer: Self-pay | Admitting: Family Medicine

## 2016-06-14 MED ORDER — ATORVASTATIN CALCIUM 40 MG PO TABS: 40.0000 mg | ORAL_TABLET | Freq: Every day | ORAL | 3 refills | Status: DC

## 2016-08-10 ENCOUNTER — Other Ambulatory Visit: Payer: Self-pay

## 2016-08-10 MED ORDER — CIPROFLOXACIN HCL 500 MG PO TABS: 500.0000 mg | ORAL_TABLET | Freq: Two times a day (BID) | ORAL | 2 refills | Status: DC

## 2016-09-28 ENCOUNTER — Encounter: Payer: Self-pay | Admitting: *Deleted

## 2016-11-23 ENCOUNTER — Telehealth: Payer: Self-pay | Admitting: Family Medicine

## 2016-11-23 DIAGNOSIS — F411 Generalized anxiety disorder: Secondary | ICD-10-CM | POA: Diagnosis not present

## 2016-11-23 NOTE — Telephone Encounter (Signed)
Disregard message

## 2016-11-29 ENCOUNTER — Encounter: Payer: Self-pay | Admitting: Family Medicine

## 2016-12-15 ENCOUNTER — Telehealth: Payer: Self-pay | Admitting: Family Medicine

## 2016-12-15 MED ORDER — LORAZEPAM 2 MG PO TABS: 2.0000 mg | ORAL_TABLET | Freq: Three times a day (TID) | ORAL | 0 refills | Status: DC | PRN

## 2016-12-15 NOTE — Telephone Encounter (Signed)
Patient received a rx lorazepam 2mg  from urgent care and needs a refill. Would Dr. Milinda CaveMcGowen be able to refill this request?    [id number] [place] [place] - [address] [phone] (Phone) [phone] (Fax)    Please call patient to advise today.

## 2016-12-15 NOTE — Telephone Encounter (Signed)
I will rx #60 lorazepam but pt needs office f/u to review meds and anxiety before any FURTHER RF's of this med.

## 2016-12-15 NOTE — Telephone Encounter (Signed)
Patient notified and verbalized understanding. 

## 2016-12-16 NOTE — Telephone Encounter (Signed)
Rx faxed

## 2016-12-17 ENCOUNTER — Telehealth: Payer: Self-pay | Admitting: Family Medicine

## 2016-12-17 NOTE — Telephone Encounter (Signed)
LORazepam (ATIVAN) 2 MG tablet patient needs a refill on the medication.  [id number] [place] [place] - [address] [phone] (Phone) [phone] (Fax)   [facility] and it had him slurring words and dizzy.  Patient requests a call once rx is placed.

## 2016-12-17 NOTE — Telephone Encounter (Signed)
FYI.  [facility]. SW Victorino [facility]Jennifer [facility] who reviewed [facility] and transferred call to pharmacist Connye BurkittAlly who stated that they miss filled pts medication and that pt could bring back the alprazolam and they would refund him and fill his lorazepam. Pt was advised and voiced understanding.

## 2016-12-17 NOTE — Telephone Encounter (Signed)
Noted  

## 2017-02-24 DIAGNOSIS — M9903 Segmental and somatic dysfunction of lumbar region: Secondary | ICD-10-CM | POA: Diagnosis not present

## 2017-02-24 DIAGNOSIS — M9901 Segmental and somatic dysfunction of cervical region: Secondary | ICD-10-CM | POA: Diagnosis not present

## 2017-02-24 DIAGNOSIS — M5412 Radiculopathy, cervical region: Secondary | ICD-10-CM | POA: Diagnosis not present

## 2017-02-24 DIAGNOSIS — M5417 Radiculopathy, lumbosacral region: Secondary | ICD-10-CM | POA: Diagnosis not present

## 2017-03-01 DIAGNOSIS — M5412 Radiculopathy, cervical region: Secondary | ICD-10-CM | POA: Diagnosis not present

## 2017-03-01 DIAGNOSIS — M9901 Segmental and somatic dysfunction of cervical region: Secondary | ICD-10-CM | POA: Diagnosis not present

## 2017-03-01 DIAGNOSIS — M5417 Radiculopathy, lumbosacral region: Secondary | ICD-10-CM | POA: Diagnosis not present

## 2017-03-01 DIAGNOSIS — M9903 Segmental and somatic dysfunction of lumbar region: Secondary | ICD-10-CM | POA: Diagnosis not present

## 2017-03-03 DIAGNOSIS — M9903 Segmental and somatic dysfunction of lumbar region: Secondary | ICD-10-CM | POA: Diagnosis not present

## 2017-03-03 DIAGNOSIS — M9901 Segmental and somatic dysfunction of cervical region: Secondary | ICD-10-CM | POA: Diagnosis not present

## 2017-03-03 DIAGNOSIS — M5417 Radiculopathy, lumbosacral region: Secondary | ICD-10-CM | POA: Diagnosis not present

## 2017-03-03 DIAGNOSIS — M5412 Radiculopathy, cervical region: Secondary | ICD-10-CM | POA: Diagnosis not present

## 2017-03-07 DIAGNOSIS — M5417 Radiculopathy, lumbosacral region: Secondary | ICD-10-CM | POA: Diagnosis not present

## 2017-03-07 DIAGNOSIS — M5412 Radiculopathy, cervical region: Secondary | ICD-10-CM | POA: Diagnosis not present

## 2017-03-07 DIAGNOSIS — M9901 Segmental and somatic dysfunction of cervical region: Secondary | ICD-10-CM | POA: Diagnosis not present

## 2017-03-07 DIAGNOSIS — M9903 Segmental and somatic dysfunction of lumbar region: Secondary | ICD-10-CM | POA: Diagnosis not present

## 2017-03-10 DIAGNOSIS — M9901 Segmental and somatic dysfunction of cervical region: Secondary | ICD-10-CM | POA: Diagnosis not present

## 2017-03-10 DIAGNOSIS — M9903 Segmental and somatic dysfunction of lumbar region: Secondary | ICD-10-CM | POA: Diagnosis not present

## 2017-03-10 DIAGNOSIS — M5412 Radiculopathy, cervical region: Secondary | ICD-10-CM | POA: Diagnosis not present

## 2017-03-10 DIAGNOSIS — M5417 Radiculopathy, lumbosacral region: Secondary | ICD-10-CM | POA: Diagnosis not present

## 2017-03-14 DIAGNOSIS — M9903 Segmental and somatic dysfunction of lumbar region: Secondary | ICD-10-CM | POA: Diagnosis not present

## 2017-03-14 DIAGNOSIS — M9901 Segmental and somatic dysfunction of cervical region: Secondary | ICD-10-CM | POA: Diagnosis not present

## 2017-03-14 DIAGNOSIS — M5417 Radiculopathy, lumbosacral region: Secondary | ICD-10-CM | POA: Diagnosis not present

## 2017-03-14 DIAGNOSIS — M5412 Radiculopathy, cervical region: Secondary | ICD-10-CM | POA: Diagnosis not present

## 2017-03-17 DIAGNOSIS — M9903 Segmental and somatic dysfunction of lumbar region: Secondary | ICD-10-CM | POA: Diagnosis not present

## 2017-03-17 DIAGNOSIS — M9901 Segmental and somatic dysfunction of cervical region: Secondary | ICD-10-CM | POA: Diagnosis not present

## 2017-03-17 DIAGNOSIS — M5417 Radiculopathy, lumbosacral region: Secondary | ICD-10-CM | POA: Diagnosis not present

## 2017-03-17 DIAGNOSIS — M5412 Radiculopathy, cervical region: Secondary | ICD-10-CM | POA: Diagnosis not present

## 2017-03-21 DIAGNOSIS — M9901 Segmental and somatic dysfunction of cervical region: Secondary | ICD-10-CM | POA: Diagnosis not present

## 2017-03-21 DIAGNOSIS — M5417 Radiculopathy, lumbosacral region: Secondary | ICD-10-CM | POA: Diagnosis not present

## 2017-03-21 DIAGNOSIS — M5412 Radiculopathy, cervical region: Secondary | ICD-10-CM | POA: Diagnosis not present

## 2017-03-21 DIAGNOSIS — M9903 Segmental and somatic dysfunction of lumbar region: Secondary | ICD-10-CM | POA: Diagnosis not present

## 2017-03-24 DIAGNOSIS — M5417 Radiculopathy, lumbosacral region: Secondary | ICD-10-CM | POA: Diagnosis not present

## 2017-03-24 DIAGNOSIS — M9903 Segmental and somatic dysfunction of lumbar region: Secondary | ICD-10-CM | POA: Diagnosis not present

## 2017-03-24 DIAGNOSIS — M5412 Radiculopathy, cervical region: Secondary | ICD-10-CM | POA: Diagnosis not present

## 2017-03-24 DIAGNOSIS — M9901 Segmental and somatic dysfunction of cervical region: Secondary | ICD-10-CM | POA: Diagnosis not present

## 2017-03-28 ENCOUNTER — Other Ambulatory Visit: Payer: Self-pay | Admitting: *Deleted

## 2017-03-28 MED ORDER — LORAZEPAM 2 MG PO TABS: 2.0000 mg | ORAL_TABLET | Freq: Three times a day (TID) | ORAL | 3 refills | Status: DC | PRN

## 2017-03-28 NOTE — Telephone Encounter (Signed)
[facility]  RF request for lorazepam LOV: 06/04/16 Next ov:  04/21/17 Last written: 12/15/16 #60 w/ 0RF  Please advise. Thanks.

## 2017-03-28 NOTE — Telephone Encounter (Signed)
Rx faxed

## 2017-04-21 ENCOUNTER — Encounter: Payer: Self-pay | Admitting: Family Medicine

## 2017-04-21 ENCOUNTER — Ambulatory Visit (INDEPENDENT_AMBULATORY_CARE_PROVIDER_SITE_OTHER): Payer: BLUE CROSS/BLUE SHIELD | Admitting: Family Medicine

## 2017-04-21 VITALS — BP 122/72 | HR 54 | Temp 97.8°F | Resp 16 | Ht 73.0 in | Wt 210.8 lb

## 2017-04-21 DIAGNOSIS — Z0000 Encounter for general adult medical examination without abnormal findings: Secondary | ICD-10-CM

## 2017-04-21 DIAGNOSIS — E7800 Pure hypercholesterolemia, unspecified: Secondary | ICD-10-CM | POA: Diagnosis not present

## 2017-04-21 DIAGNOSIS — Z23 Encounter for immunization: Secondary | ICD-10-CM

## 2017-04-21 DIAGNOSIS — Z125 Encounter for screening for malignant neoplasm of prostate: Secondary | ICD-10-CM | POA: Diagnosis not present

## 2017-04-21 DIAGNOSIS — R7301 Impaired fasting glucose: Secondary | ICD-10-CM | POA: Diagnosis not present

## 2017-04-21 LAB — COMPREHENSIVE METABOLIC PANEL
ALT: 81 U/L — ABNORMAL HIGH (ref 0–53)
AST: 47 U/L — ABNORMAL HIGH (ref 0–37)
Albumin: 4.8 g/dL (ref 3.5–5.2)
Alkaline Phosphatase: 69 U/L (ref 39–117)
BUN: 16 mg/dL (ref 6–23)
CALCIUM: 9.7 mg/dL (ref 8.4–10.5)
CHLORIDE: 102 meq/L (ref 96–112)
CO2: 30 meq/L (ref 19–32)
Creatinine, Ser: 0.97 mg/dL (ref 0.40–1.50)
GFR: 84.06 mL/min (ref >60.00)
GLUCOSE: 115 mg/dL — AB (ref 70–99)
POTASSIUM: 4.1 meq/L (ref 3.5–5.1)
Sodium: 138 mEq/L (ref 135–145)
Total Bilirubin: 1.2 mg/dL (ref 0.2–1.2)
Total Protein: 7.1 g/dL (ref 6.0–8.3)

## 2017-04-21 LAB — CBC WITH DIFFERENTIAL/PLATELET
BASOS PCT: 1 % (ref 0.0–3.0)
Basophils Absolute: 0.1 10*3/uL (ref 0.0–0.1)
EOS PCT: 2.9 % (ref 0.0–5.0)
Eosinophils Absolute: 0.2 10*3/uL (ref 0.0–0.7)
HEMATOCRIT: 48.7 % (ref 39.0–52.0)
HEMOGLOBIN: 16.6 g/dL (ref 13.0–17.0)
LYMPHS PCT: 22.2 % (ref 12.0–46.0)
Lymphs Abs: 1.3 10*3/uL (ref 0.7–4.0)
MCHC: 34.1 g/dL (ref 30.0–36.0)
MCV: 90 fl (ref 78.0–100.0)
MONO ABS: 0.6 10*3/uL (ref 0.1–1.0)
Monocytes Relative: 10 % (ref 3.0–12.0)
Neutro Abs: 3.9 10*3/uL (ref 1.4–7.7)
Neutrophils Relative %: 63.9 % (ref 43.0–77.0)
Platelets: 206 10*3/uL (ref 150.0–400.0)
RBC: 5.41 Mil/uL (ref 4.22–5.81)
RDW: 12.7 % (ref 11.5–15.5)
WBC: 6.1 10*3/uL (ref 4.0–10.5)

## 2017-04-21 LAB — LIPID PANEL
Cholesterol: 213 mg/dL — ABNORMAL HIGH (ref 0–200)
HDL: 43.3 mg/dL (ref >39.00)
LDL CALC: 138 mg/dL — AB (ref 0–99)
NONHDL: 169.32
TRIGLYCERIDES: 155 mg/dL — AB (ref 0.0–149.0)
Total CHOL/HDL Ratio: 5
VLDL: 31 mg/dL (ref 0.0–40.0)

## 2017-04-21 LAB — HEMOGLOBIN A1C: HEMOGLOBIN A1C: 6.2 % (ref 4.6–6.5)

## 2017-04-21 LAB — PSA: PSA: 1.19 ng/mL (ref 0.10–4.00)

## 2017-04-21 LAB — TSH: TSH: 0.88 u[IU]/mL (ref 0.35–4.50)

## 2017-04-21 NOTE — Patient Instructions (Signed)
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
## 2017-04-21 NOTE — Addendum Note (Signed)
Addended by: Smitty KnudsenSUTHERLAND, Delona Clasby K on: 04/21/2017 10:34 AM   Modules accepted: Orders

## 2017-04-25 ENCOUNTER — Encounter: Payer: Self-pay | Admitting: Family Medicine

## 2017-04-25 DIAGNOSIS — M9901 Segmental and somatic dysfunction of cervical region: Secondary | ICD-10-CM | POA: Diagnosis not present

## 2017-04-25 DIAGNOSIS — M5412 Radiculopathy, cervical region: Secondary | ICD-10-CM | POA: Diagnosis not present

## 2017-04-25 DIAGNOSIS — M9903 Segmental and somatic dysfunction of lumbar region: Secondary | ICD-10-CM | POA: Diagnosis not present

## 2017-04-25 DIAGNOSIS — M5417 Radiculopathy, lumbosacral region: Secondary | ICD-10-CM | POA: Diagnosis not present

## 2017-05-19 DIAGNOSIS — M9903 Segmental and somatic dysfunction of lumbar region: Secondary | ICD-10-CM | POA: Diagnosis not present

## 2017-05-19 DIAGNOSIS — M5417 Radiculopathy, lumbosacral region: Secondary | ICD-10-CM | POA: Diagnosis not present

## 2017-05-19 DIAGNOSIS — M5412 Radiculopathy, cervical region: Secondary | ICD-10-CM | POA: Diagnosis not present

## 2017-05-19 DIAGNOSIS — M9901 Segmental and somatic dysfunction of cervical region: Secondary | ICD-10-CM | POA: Diagnosis not present

## 2017-06-13 DIAGNOSIS — M9903 Segmental and somatic dysfunction of lumbar region: Secondary | ICD-10-CM | POA: Diagnosis not present

## 2017-06-13 DIAGNOSIS — M5417 Radiculopathy, lumbosacral region: Secondary | ICD-10-CM | POA: Diagnosis not present

## 2017-06-13 DIAGNOSIS — M9901 Segmental and somatic dysfunction of cervical region: Secondary | ICD-10-CM | POA: Diagnosis not present

## 2017-06-13 DIAGNOSIS — M5412 Radiculopathy, cervical region: Secondary | ICD-10-CM | POA: Diagnosis not present

## 2017-06-22 ENCOUNTER — Other Ambulatory Visit: Payer: Self-pay | Admitting: *Deleted

## 2017-06-22 DIAGNOSIS — Z8679 Personal history of other diseases of the circulatory system: Secondary | ICD-10-CM

## 2017-06-22 MED ORDER — FLECAINIDE ACETATE 50 MG PO TABS
50.0000 mg | ORAL_TABLET | Freq: Two times a day (BID) | ORAL | 3 refills | Status: DC
Start: 2017-06-22 — End: 2018-07-14

## 2017-06-22 NOTE — Telephone Encounter (Signed)
[facility]  RF request for flecainide LOV: 04/21/17 Next ov: 10/17/17 Last written: 05/14/16 #180 w/ 0RF  Please advise. Thanks.

## 2017-08-01 DIAGNOSIS — M9903 Segmental and somatic dysfunction of lumbar region: Secondary | ICD-10-CM | POA: Diagnosis not present

## 2017-08-01 DIAGNOSIS — M62830 Muscle spasm of back: Secondary | ICD-10-CM | POA: Diagnosis not present

## 2017-08-01 DIAGNOSIS — M5412 Radiculopathy, cervical region: Secondary | ICD-10-CM | POA: Diagnosis not present

## 2017-08-01 DIAGNOSIS — M9901 Segmental and somatic dysfunction of cervical region: Secondary | ICD-10-CM | POA: Diagnosis not present

## 2017-10-13 DIAGNOSIS — M62830 Muscle spasm of back: Secondary | ICD-10-CM | POA: Diagnosis not present

## 2017-10-13 DIAGNOSIS — M9903 Segmental and somatic dysfunction of lumbar region: Secondary | ICD-10-CM | POA: Diagnosis not present

## 2017-10-13 DIAGNOSIS — M9901 Segmental and somatic dysfunction of cervical region: Secondary | ICD-10-CM | POA: Diagnosis not present

## 2017-10-13 DIAGNOSIS — M5412 Radiculopathy, cervical region: Secondary | ICD-10-CM | POA: Diagnosis not present

## 2017-10-17 ENCOUNTER — Ambulatory Visit: Payer: BLUE CROSS/BLUE SHIELD | Admitting: Family Medicine

## 2017-11-10 DIAGNOSIS — M62830 Muscle spasm of back: Secondary | ICD-10-CM | POA: Diagnosis not present

## 2017-11-10 DIAGNOSIS — M5412 Radiculopathy, cervical region: Secondary | ICD-10-CM | POA: Diagnosis not present

## 2017-11-10 DIAGNOSIS — M9901 Segmental and somatic dysfunction of cervical region: Secondary | ICD-10-CM | POA: Diagnosis not present

## 2017-11-10 DIAGNOSIS — M9903 Segmental and somatic dysfunction of lumbar region: Secondary | ICD-10-CM | POA: Diagnosis not present

## 2017-12-05 DIAGNOSIS — M5090 Cervical disc disorder, unspecified, unspecified cervical region: Secondary | ICD-10-CM | POA: Diagnosis not present

## 2017-12-05 DIAGNOSIS — R52 Pain, unspecified: Secondary | ICD-10-CM | POA: Diagnosis not present

## 2017-12-07 DIAGNOSIS — M5090 Cervical disc disorder, unspecified, unspecified cervical region: Secondary | ICD-10-CM | POA: Insufficient documentation

## 2017-12-08 DIAGNOSIS — M5412 Radiculopathy, cervical region: Secondary | ICD-10-CM | POA: Diagnosis not present

## 2017-12-08 DIAGNOSIS — M9901 Segmental and somatic dysfunction of cervical region: Secondary | ICD-10-CM | POA: Diagnosis not present

## 2017-12-08 DIAGNOSIS — M9903 Segmental and somatic dysfunction of lumbar region: Secondary | ICD-10-CM | POA: Diagnosis not present

## 2017-12-08 DIAGNOSIS — M62830 Muscle spasm of back: Secondary | ICD-10-CM | POA: Diagnosis not present

## 2017-12-16 ENCOUNTER — Telehealth: Payer: Self-pay | Admitting: Family Medicine

## 2017-12-16 NOTE — Telephone Encounter (Signed)
Copied from CRM [id number]. Topic: Inquiry >> Dec 16, 2017  3:50 PM Eston MouldDavis, Cheri B wrote: Reason for CRM: Pt called with two questions-  He had part one of shingrix  vaccination in July of 2018-  he wants to know if it is too late to get part 2? His second question is -  Can he bring by a wellness form from his place of employment  and have office staff complete it?    Will check with Dr. Milinda CaveMcGowen when he gets back on Monday, 12/19/17.

## 2017-12-16 NOTE — Telephone Encounter (Signed)
Patient walked in to office to drop off form to have completed by pcp.  He also mentioned that he received his initial shingles vaccine in July, 2018 but never came back for 2nd vaccine.    He wants to know if he is able to get the 2nd dose or does he have to restart the series.   Pt aware pcp out of office today and it would be next week before he recieves a call back.

## 2017-12-19 NOTE — Telephone Encounter (Signed)
Yes he can make nurse appt for shingrix #2. Yes he can drop off his wellness form.-thx

## 2017-12-20 ENCOUNTER — Ambulatory Visit (INDEPENDENT_AMBULATORY_CARE_PROVIDER_SITE_OTHER): Payer: BLUE CROSS/BLUE SHIELD

## 2017-12-20 DIAGNOSIS — Z23 Encounter for immunization: Secondary | ICD-10-CM

## 2017-12-20 NOTE — Progress Notes (Signed)
Patient presents today for #2 of 2 Shingrix vaccine. Given with no incidence or problems. Patient left with no complaints.

## 2017-12-20 NOTE — Telephone Encounter (Signed)
Patient notified and appointment scheduled for nurse visit.

## 2018-02-28 DIAGNOSIS — M62830 Muscle spasm of back: Secondary | ICD-10-CM | POA: Diagnosis not present

## 2018-02-28 DIAGNOSIS — M5412 Radiculopathy, cervical region: Secondary | ICD-10-CM | POA: Diagnosis not present

## 2018-02-28 DIAGNOSIS — M9901 Segmental and somatic dysfunction of cervical region: Secondary | ICD-10-CM | POA: Diagnosis not present

## 2018-02-28 DIAGNOSIS — M9903 Segmental and somatic dysfunction of lumbar region: Secondary | ICD-10-CM | POA: Diagnosis not present

## 2018-03-16 DIAGNOSIS — M62830 Muscle spasm of back: Secondary | ICD-10-CM | POA: Diagnosis not present

## 2018-03-16 DIAGNOSIS — M5412 Radiculopathy, cervical region: Secondary | ICD-10-CM | POA: Diagnosis not present

## 2018-03-16 DIAGNOSIS — M9903 Segmental and somatic dysfunction of lumbar region: Secondary | ICD-10-CM | POA: Diagnosis not present

## 2018-03-16 DIAGNOSIS — M9901 Segmental and somatic dysfunction of cervical region: Secondary | ICD-10-CM | POA: Diagnosis not present

## 2018-03-28 ENCOUNTER — Other Ambulatory Visit: Payer: Self-pay | Admitting: Family Medicine

## 2018-04-07 DIAGNOSIS — M25511 Pain in right shoulder: Secondary | ICD-10-CM | POA: Diagnosis not present

## 2018-04-07 DIAGNOSIS — M25512 Pain in left shoulder: Secondary | ICD-10-CM | POA: Diagnosis not present

## 2018-04-13 DIAGNOSIS — M62830 Muscle spasm of back: Secondary | ICD-10-CM | POA: Diagnosis not present

## 2018-04-13 DIAGNOSIS — M5412 Radiculopathy, cervical region: Secondary | ICD-10-CM | POA: Diagnosis not present

## 2018-04-13 DIAGNOSIS — M9903 Segmental and somatic dysfunction of lumbar region: Secondary | ICD-10-CM | POA: Diagnosis not present

## 2018-04-13 DIAGNOSIS — M9901 Segmental and somatic dysfunction of cervical region: Secondary | ICD-10-CM | POA: Diagnosis not present

## 2018-05-11 DIAGNOSIS — M5412 Radiculopathy, cervical region: Secondary | ICD-10-CM | POA: Diagnosis not present

## 2018-05-11 DIAGNOSIS — M62830 Muscle spasm of back: Secondary | ICD-10-CM | POA: Diagnosis not present

## 2018-05-11 DIAGNOSIS — M9903 Segmental and somatic dysfunction of lumbar region: Secondary | ICD-10-CM | POA: Diagnosis not present

## 2018-05-11 DIAGNOSIS — M9901 Segmental and somatic dysfunction of cervical region: Secondary | ICD-10-CM | POA: Diagnosis not present

## 2018-05-15 DIAGNOSIS — M25511 Pain in right shoulder: Secondary | ICD-10-CM | POA: Diagnosis not present

## 2018-05-15 DIAGNOSIS — M25512 Pain in left shoulder: Secondary | ICD-10-CM | POA: Diagnosis not present

## 2018-05-22 DIAGNOSIS — M25511 Pain in right shoulder: Secondary | ICD-10-CM | POA: Diagnosis not present

## 2018-05-22 DIAGNOSIS — M25512 Pain in left shoulder: Secondary | ICD-10-CM | POA: Diagnosis not present

## 2018-06-23 DIAGNOSIS — M25511 Pain in right shoulder: Secondary | ICD-10-CM | POA: Diagnosis not present

## 2018-06-23 DIAGNOSIS — M25512 Pain in left shoulder: Secondary | ICD-10-CM | POA: Diagnosis not present

## 2018-06-30 ENCOUNTER — Other Ambulatory Visit: Payer: Self-pay | Admitting: Family Medicine

## 2018-06-30 DIAGNOSIS — Z8679 Personal history of other diseases of the circulatory system: Secondary | ICD-10-CM

## 2018-07-06 DIAGNOSIS — M7541 Impingement syndrome of right shoulder: Secondary | ICD-10-CM | POA: Diagnosis not present

## 2018-07-06 DIAGNOSIS — G8918 Other acute postprocedural pain: Secondary | ICD-10-CM | POA: Diagnosis not present

## 2018-07-06 DIAGNOSIS — M19012 Primary osteoarthritis, left shoulder: Secondary | ICD-10-CM | POA: Diagnosis not present

## 2018-07-06 DIAGNOSIS — S43431A Superior glenoid labrum lesion of right shoulder, initial encounter: Secondary | ICD-10-CM | POA: Diagnosis not present

## 2018-07-06 DIAGNOSIS — Z79899 Other long term (current) drug therapy: Secondary | ICD-10-CM | POA: Diagnosis not present

## 2018-07-06 DIAGNOSIS — Z791 Long term (current) use of non-steroidal anti-inflammatories (NSAID): Secondary | ICD-10-CM | POA: Diagnosis not present

## 2018-07-06 DIAGNOSIS — M66311 Spontaneous rupture of flexor tendons, right shoulder: Secondary | ICD-10-CM | POA: Diagnosis not present

## 2018-07-06 DIAGNOSIS — M19011 Primary osteoarthritis, right shoulder: Secondary | ICD-10-CM | POA: Diagnosis not present

## 2018-07-08 DIAGNOSIS — M79621 Pain in right upper arm: Secondary | ICD-10-CM | POA: Diagnosis not present

## 2018-07-08 DIAGNOSIS — G8918 Other acute postprocedural pain: Secondary | ICD-10-CM | POA: Diagnosis not present

## 2018-07-14 ENCOUNTER — Other Ambulatory Visit: Payer: Self-pay | Admitting: Family Medicine

## 2018-07-14 DIAGNOSIS — Z8679 Personal history of other diseases of the circulatory system: Secondary | ICD-10-CM

## 2018-07-14 NOTE — Telephone Encounter (Signed)
I see he just had shoulder surgery. I'll RF his flecainide for 30d supply with 1 RF, but I have to see him before any FURTHER RF's b/c I have not seen him in office for over a year.-thx

## 2018-07-14 NOTE — Telephone Encounter (Signed)
RF request for flecainide LOV: 04/21/17 Next ov: None Last written: 06/22/17 #180 w/ 3RF  Please advise. Thanks.

## 2018-07-18 DIAGNOSIS — J342 Deviated nasal septum: Secondary | ICD-10-CM | POA: Diagnosis not present

## 2018-07-18 DIAGNOSIS — G4733 Obstructive sleep apnea (adult) (pediatric): Secondary | ICD-10-CM | POA: Diagnosis not present

## 2018-07-18 DIAGNOSIS — J343 Hypertrophy of nasal turbinates: Secondary | ICD-10-CM | POA: Diagnosis not present

## 2018-07-19 ENCOUNTER — Encounter: Payer: Self-pay | Admitting: Family Medicine

## 2018-07-19 DIAGNOSIS — R6889 Other general symptoms and signs: Secondary | ICD-10-CM | POA: Diagnosis not present

## 2018-07-19 DIAGNOSIS — M25511 Pain in right shoulder: Secondary | ICD-10-CM | POA: Diagnosis not present

## 2018-07-25 DIAGNOSIS — M25511 Pain in right shoulder: Secondary | ICD-10-CM | POA: Diagnosis not present

## 2018-07-25 DIAGNOSIS — R6889 Other general symptoms and signs: Secondary | ICD-10-CM | POA: Diagnosis not present

## 2018-07-25 NOTE — Telephone Encounter (Signed)
Left message for pt to call back or check mychart.  Okay for PEC to advise pt. 

## 2018-08-01 DIAGNOSIS — M25511 Pain in right shoulder: Secondary | ICD-10-CM | POA: Diagnosis not present

## 2018-08-01 DIAGNOSIS — R6889 Other general symptoms and signs: Secondary | ICD-10-CM | POA: Diagnosis not present

## 2018-08-01 NOTE — Telephone Encounter (Signed)
Left message for pt to call back.   Okay for PEC to advise pt. 

## 2018-08-07 NOTE — Telephone Encounter (Signed)
Pt has not returned my call and has not check him mychart. Will save message to chart.

## 2018-08-08 ENCOUNTER — Other Ambulatory Visit: Payer: Self-pay | Admitting: Family Medicine

## 2018-08-08 DIAGNOSIS — Z8679 Personal history of other diseases of the circulatory system: Secondary | ICD-10-CM

## 2018-08-08 DIAGNOSIS — R6889 Other general symptoms and signs: Secondary | ICD-10-CM | POA: Diagnosis not present

## 2018-08-08 DIAGNOSIS — M25512 Pain in left shoulder: Secondary | ICD-10-CM | POA: Diagnosis not present

## 2018-08-15 DIAGNOSIS — M25512 Pain in left shoulder: Secondary | ICD-10-CM | POA: Diagnosis not present

## 2018-08-15 DIAGNOSIS — R6889 Other general symptoms and signs: Secondary | ICD-10-CM | POA: Diagnosis not present

## 2018-08-22 DIAGNOSIS — R6889 Other general symptoms and signs: Secondary | ICD-10-CM | POA: Diagnosis not present

## 2018-08-22 DIAGNOSIS — M25512 Pain in left shoulder: Secondary | ICD-10-CM | POA: Diagnosis not present

## 2018-08-22 DIAGNOSIS — M25511 Pain in right shoulder: Secondary | ICD-10-CM | POA: Diagnosis not present

## 2018-08-23 ENCOUNTER — Encounter: Payer: Self-pay | Admitting: *Deleted

## 2018-09-05 DIAGNOSIS — M25512 Pain in left shoulder: Secondary | ICD-10-CM | POA: Diagnosis not present

## 2018-09-05 DIAGNOSIS — R6889 Other general symptoms and signs: Secondary | ICD-10-CM | POA: Diagnosis not present

## 2018-09-16 DIAGNOSIS — G4733 Obstructive sleep apnea (adult) (pediatric): Secondary | ICD-10-CM | POA: Diagnosis not present

## 2018-09-19 DIAGNOSIS — G4733 Obstructive sleep apnea (adult) (pediatric): Secondary | ICD-10-CM | POA: Insufficient documentation

## 2018-09-20 DIAGNOSIS — S43432A Superior glenoid labrum lesion of left shoulder, initial encounter: Secondary | ICD-10-CM | POA: Diagnosis not present

## 2018-09-20 DIAGNOSIS — M24112 Other articular cartilage disorders, left shoulder: Secondary | ICD-10-CM | POA: Diagnosis not present

## 2018-09-20 DIAGNOSIS — Z79899 Other long term (current) drug therapy: Secondary | ICD-10-CM | POA: Diagnosis not present

## 2018-09-20 DIAGNOSIS — M7522 Bicipital tendinitis, left shoulder: Secondary | ICD-10-CM | POA: Diagnosis not present

## 2018-09-20 DIAGNOSIS — M7542 Impingement syndrome of left shoulder: Secondary | ICD-10-CM | POA: Diagnosis not present

## 2018-09-20 DIAGNOSIS — M66312 Spontaneous rupture of flexor tendons, left shoulder: Secondary | ICD-10-CM | POA: Diagnosis not present

## 2018-09-20 DIAGNOSIS — G8918 Other acute postprocedural pain: Secondary | ICD-10-CM | POA: Diagnosis not present

## 2018-09-20 DIAGNOSIS — I4891 Unspecified atrial fibrillation: Secondary | ICD-10-CM | POA: Diagnosis not present

## 2018-09-20 DIAGNOSIS — M75122 Complete rotator cuff tear or rupture of left shoulder, not specified as traumatic: Secondary | ICD-10-CM | POA: Diagnosis not present

## 2018-09-20 DIAGNOSIS — M75112 Incomplete rotator cuff tear or rupture of left shoulder, not specified as traumatic: Secondary | ICD-10-CM | POA: Diagnosis not present

## 2018-10-04 DIAGNOSIS — G8929 Other chronic pain: Secondary | ICD-10-CM

## 2018-10-04 DIAGNOSIS — M25512 Pain in left shoulder: Secondary | ICD-10-CM

## 2018-10-04 HISTORY — DX: Other chronic pain: G89.29

## 2018-10-04 HISTORY — DX: Pain in left shoulder: M25.512

## 2018-10-04 HISTORY — PX: SHOULDER SURGERY: SHX246

## 2018-10-12 DIAGNOSIS — R6889 Other general symptoms and signs: Secondary | ICD-10-CM | POA: Diagnosis not present

## 2018-10-12 DIAGNOSIS — M25512 Pain in left shoulder: Secondary | ICD-10-CM | POA: Diagnosis not present

## 2018-10-19 DIAGNOSIS — R6889 Other general symptoms and signs: Secondary | ICD-10-CM | POA: Diagnosis not present

## 2018-10-19 DIAGNOSIS — M25512 Pain in left shoulder: Secondary | ICD-10-CM | POA: Diagnosis not present

## 2018-10-27 DIAGNOSIS — M25512 Pain in left shoulder: Secondary | ICD-10-CM | POA: Diagnosis not present

## 2018-10-27 DIAGNOSIS — R6889 Other general symptoms and signs: Secondary | ICD-10-CM | POA: Diagnosis not present

## 2018-11-02 DIAGNOSIS — R6889 Other general symptoms and signs: Secondary | ICD-10-CM | POA: Diagnosis not present

## 2018-11-02 DIAGNOSIS — M25512 Pain in left shoulder: Secondary | ICD-10-CM | POA: Diagnosis not present

## 2018-11-09 DIAGNOSIS — M25512 Pain in left shoulder: Secondary | ICD-10-CM | POA: Diagnosis not present

## 2018-11-09 DIAGNOSIS — R6889 Other general symptoms and signs: Secondary | ICD-10-CM | POA: Diagnosis not present

## 2018-11-16 DIAGNOSIS — M25512 Pain in left shoulder: Secondary | ICD-10-CM | POA: Diagnosis not present

## 2018-11-16 DIAGNOSIS — R6889 Other general symptoms and signs: Secondary | ICD-10-CM | POA: Diagnosis not present

## 2018-11-23 DIAGNOSIS — R6889 Other general symptoms and signs: Secondary | ICD-10-CM | POA: Diagnosis not present

## 2018-11-23 DIAGNOSIS — M25512 Pain in left shoulder: Secondary | ICD-10-CM | POA: Diagnosis not present

## 2018-11-30 DIAGNOSIS — R6889 Other general symptoms and signs: Secondary | ICD-10-CM | POA: Diagnosis not present

## 2018-11-30 DIAGNOSIS — M25512 Pain in left shoulder: Secondary | ICD-10-CM | POA: Diagnosis not present

## 2018-12-07 DIAGNOSIS — M25512 Pain in left shoulder: Secondary | ICD-10-CM | POA: Diagnosis not present

## 2018-12-07 DIAGNOSIS — R6889 Other general symptoms and signs: Secondary | ICD-10-CM | POA: Diagnosis not present

## 2019-01-14 DIAGNOSIS — E860 Dehydration: Secondary | ICD-10-CM | POA: Diagnosis not present

## 2019-01-14 DIAGNOSIS — E1165 Type 2 diabetes mellitus with hyperglycemia: Secondary | ICD-10-CM | POA: Diagnosis not present

## 2019-01-14 DIAGNOSIS — R358 Other polyuria: Secondary | ICD-10-CM | POA: Diagnosis not present

## 2019-01-14 DIAGNOSIS — I10 Essential (primary) hypertension: Secondary | ICD-10-CM | POA: Diagnosis not present

## 2019-01-14 DIAGNOSIS — Z79899 Other long term (current) drug therapy: Secondary | ICD-10-CM | POA: Diagnosis not present

## 2019-01-14 DIAGNOSIS — E119 Type 2 diabetes mellitus without complications: Secondary | ICD-10-CM | POA: Diagnosis not present

## 2019-01-14 DIAGNOSIS — Z7984 Long term (current) use of oral hypoglycemic drugs: Secondary | ICD-10-CM | POA: Diagnosis not present

## 2019-01-14 DIAGNOSIS — R0602 Shortness of breath: Secondary | ICD-10-CM | POA: Diagnosis not present

## 2019-01-14 MED ORDER — GENERIC EXTERNAL MEDICATION
Status: DC
Start: ? — End: 2019-01-14

## 2019-01-14 MED ORDER — BARO-CAT PO
10.00 | ORAL | Status: DC
Start: ? — End: 2019-01-14

## 2019-01-18 DIAGNOSIS — R6889 Other general symptoms and signs: Secondary | ICD-10-CM | POA: Diagnosis not present

## 2019-01-18 DIAGNOSIS — M25512 Pain in left shoulder: Secondary | ICD-10-CM | POA: Diagnosis not present

## 2019-01-29 DIAGNOSIS — M25511 Pain in right shoulder: Secondary | ICD-10-CM | POA: Diagnosis not present

## 2019-01-29 DIAGNOSIS — M25512 Pain in left shoulder: Secondary | ICD-10-CM | POA: Diagnosis not present

## 2019-02-09 ENCOUNTER — Encounter: Payer: Self-pay | Admitting: Family Medicine

## 2019-02-09 ENCOUNTER — Ambulatory Visit (INDEPENDENT_AMBULATORY_CARE_PROVIDER_SITE_OTHER): Payer: BLUE CROSS/BLUE SHIELD | Admitting: Family Medicine

## 2019-02-09 VITALS — BP 120/75 | HR 60

## 2019-02-09 DIAGNOSIS — N451 Epididymitis: Secondary | ICD-10-CM

## 2019-02-09 DIAGNOSIS — E119 Type 2 diabetes mellitus without complications: Secondary | ICD-10-CM

## 2019-02-09 MED ORDER — CIPROFLOXACIN HCL 500 MG PO TABS: 500.0000 mg | ORAL_TABLET | Freq: Two times a day (BID) | ORAL | 2 refills | Status: DC

## 2019-02-09 MED ORDER — METFORMIN HCL 500 MG PO TABS: 500.0000 mg | ORAL_TABLET | Freq: Two times a day (BID) | ORAL | 3 refills | Status: DC
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
## 2019-03-12 DIAGNOSIS — M25512 Pain in left shoulder: Secondary | ICD-10-CM | POA: Diagnosis not present

## 2019-03-18 ENCOUNTER — Other Ambulatory Visit: Payer: Self-pay | Admitting: Family Medicine

## 2019-04-09 DIAGNOSIS — H524 Presbyopia: Secondary | ICD-10-CM | POA: Diagnosis not present

## 2019-04-09 DIAGNOSIS — H52223 Regular astigmatism, bilateral: Secondary | ICD-10-CM | POA: Diagnosis not present

## 2019-04-09 LAB — HM DIABETES EYE EXAM

## 2019-04-10 ENCOUNTER — Encounter: Payer: Self-pay | Admitting: Family Medicine

## 2019-04-23 DIAGNOSIS — M25512 Pain in left shoulder: Secondary | ICD-10-CM | POA: Diagnosis not present

## 2019-04-23 DIAGNOSIS — M25511 Pain in right shoulder: Secondary | ICD-10-CM | POA: Diagnosis not present

## 2019-04-30 DIAGNOSIS — M25512 Pain in left shoulder: Secondary | ICD-10-CM | POA: Diagnosis not present

## 2019-05-07 DIAGNOSIS — M25512 Pain in left shoulder: Secondary | ICD-10-CM | POA: Diagnosis not present

## 2019-05-07 DIAGNOSIS — M25511 Pain in right shoulder: Secondary | ICD-10-CM | POA: Diagnosis not present

## 2019-05-14 DIAGNOSIS — M25511 Pain in right shoulder: Secondary | ICD-10-CM | POA: Diagnosis not present

## 2019-05-30 DIAGNOSIS — M25512 Pain in left shoulder: Secondary | ICD-10-CM | POA: Diagnosis not present

## 2019-05-30 DIAGNOSIS — M25511 Pain in right shoulder: Secondary | ICD-10-CM | POA: Diagnosis not present

## 2019-06-12 DIAGNOSIS — R6889 Other general symptoms and signs: Secondary | ICD-10-CM | POA: Diagnosis not present

## 2019-06-12 DIAGNOSIS — M25511 Pain in right shoulder: Secondary | ICD-10-CM | POA: Diagnosis not present

## 2019-06-12 DIAGNOSIS — M25512 Pain in left shoulder: Secondary | ICD-10-CM | POA: Diagnosis not present

## 2019-06-25 DIAGNOSIS — M25511 Pain in right shoulder: Secondary | ICD-10-CM | POA: Diagnosis not present

## 2019-06-25 DIAGNOSIS — M25512 Pain in left shoulder: Secondary | ICD-10-CM | POA: Diagnosis not present

## 2019-06-25 DIAGNOSIS — R6889 Other general symptoms and signs: Secondary | ICD-10-CM | POA: Diagnosis not present

## 2019-06-28 ENCOUNTER — Other Ambulatory Visit: Payer: Self-pay | Admitting: Family Medicine

## 2019-06-28 NOTE — Telephone Encounter (Signed)
Medication: flecainide (TAMBOCOR) 50 MG tablet [[id number]]   Has the patient contacted their pharmacy? Yes  (Agent: If no, request that the patient contact the pharmacy for the refill.) (Agent: If yes, when and what did the pharmacy advise?)  Preferred Pharmacy (with phone number or street name): [id number] [place], [address] [phone] (Phone) [phone] (Fax)    Agent: Please be advised that RX refills may take up to 3 business days. We ask that you follow-up with your pharmacy.

## 2019-06-29 ENCOUNTER — Telehealth: Payer: Self-pay | Admitting: Family Medicine

## 2019-06-29 DIAGNOSIS — N529 Male erectile dysfunction, unspecified: Secondary | ICD-10-CM

## 2019-06-29 NOTE — Telephone Encounter (Signed)
Please advise, if you would like patient to be seen or to send the referral. Thank you

## 2019-06-29 NOTE — Telephone Encounter (Signed)
RF request for flecainide?  Not previously prescribed by Dr Anitra Lauth.  Please advise.

## 2019-06-29 NOTE — Telephone Encounter (Signed)
Patient request for a referral to see Urologist. Dr. Anitra Lauth has seen him for this issue before. Patient would not go into further detail.  Thank you

## 2019-07-02 DIAGNOSIS — R6889 Other general symptoms and signs: Secondary | ICD-10-CM | POA: Diagnosis not present

## 2019-07-02 DIAGNOSIS — M25512 Pain in left shoulder: Secondary | ICD-10-CM | POA: Diagnosis not present

## 2019-07-02 DIAGNOSIS — M25511 Pain in right shoulder: Secondary | ICD-10-CM | POA: Diagnosis not present

## 2019-07-02 NOTE — Telephone Encounter (Signed)
I am going to deny this RF. This a medication specifically prescribed by cardiologists. -thx

## 2019-07-02 NOTE — Telephone Encounter (Signed)
referral has been ordered. lvm to let patient know that referral has been ordered and someone will be contacting him

## 2019-07-04 ENCOUNTER — Other Ambulatory Visit: Payer: Self-pay | Admitting: Family Medicine

## 2019-07-11 DIAGNOSIS — M25511 Pain in right shoulder: Secondary | ICD-10-CM | POA: Diagnosis not present

## 2019-07-11 DIAGNOSIS — R6889 Other general symptoms and signs: Secondary | ICD-10-CM | POA: Diagnosis not present

## 2019-07-11 DIAGNOSIS — M7521 Bicipital tendinitis, right shoulder: Secondary | ICD-10-CM | POA: Diagnosis not present

## 2019-07-11 DIAGNOSIS — M19012 Primary osteoarthritis, left shoulder: Secondary | ICD-10-CM | POA: Diagnosis not present

## 2019-07-11 DIAGNOSIS — M25512 Pain in left shoulder: Secondary | ICD-10-CM | POA: Diagnosis not present

## 2019-07-18 DIAGNOSIS — M25512 Pain in left shoulder: Secondary | ICD-10-CM | POA: Diagnosis not present

## 2019-07-18 DIAGNOSIS — M25511 Pain in right shoulder: Secondary | ICD-10-CM | POA: Diagnosis not present

## 2019-07-18 DIAGNOSIS — R6889 Other general symptoms and signs: Secondary | ICD-10-CM | POA: Diagnosis not present

## 2019-07-19 DIAGNOSIS — M19012 Primary osteoarthritis, left shoulder: Secondary | ICD-10-CM | POA: Diagnosis not present

## 2019-07-19 DIAGNOSIS — M7521 Bicipital tendinitis, right shoulder: Secondary | ICD-10-CM | POA: Insufficient documentation

## 2019-07-25 ENCOUNTER — Encounter: Payer: Self-pay | Admitting: Family Medicine

## 2019-07-25 ENCOUNTER — Encounter: Payer: BLUE CROSS/BLUE SHIELD | Admitting: Family Medicine

## 2019-07-25 DIAGNOSIS — Z1159 Encounter for screening for other viral diseases: Secondary | ICD-10-CM | POA: Diagnosis not present
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
## 2019-08-03 DIAGNOSIS — N5201 Erectile dysfunction due to arterial insufficiency: Secondary | ICD-10-CM | POA: Diagnosis not present

## 2019-08-03 DIAGNOSIS — N448 Other noninflammatory disorders of the testis: Secondary | ICD-10-CM | POA: Diagnosis not present

## 2019-08-03 DIAGNOSIS — N5312 Painful ejaculation: Secondary | ICD-10-CM | POA: Diagnosis not present

## 2019-09-06 DIAGNOSIS — M7521 Bicipital tendinitis, right shoulder: Secondary | ICD-10-CM | POA: Diagnosis not present

## 2019-09-06 DIAGNOSIS — M19012 Primary osteoarthritis, left shoulder: Secondary | ICD-10-CM | POA: Diagnosis not present

## 2019-09-06 DIAGNOSIS — E119 Type 2 diabetes mellitus without complications: Secondary | ICD-10-CM | POA: Diagnosis not present

## 2019-09-06 DIAGNOSIS — G4730 Sleep apnea, unspecified: Secondary | ICD-10-CM | POA: Diagnosis not present

## 2019-09-19 ENCOUNTER — Other Ambulatory Visit: Payer: Self-pay

## 2019-09-20 ENCOUNTER — Other Ambulatory Visit: Payer: Self-pay

## 2019-09-20 ENCOUNTER — Ambulatory Visit (INDEPENDENT_AMBULATORY_CARE_PROVIDER_SITE_OTHER): Payer: BC Managed Care – PPO | Admitting: Family Medicine

## 2019-09-20 ENCOUNTER — Encounter: Payer: Self-pay | Admitting: Family Medicine

## 2019-09-20 VITALS — BP 133/83 | HR 65 | Temp 97.6°F | Resp 16 | Ht 73.0 in | Wt 202.2 lb

## 2019-09-20 DIAGNOSIS — E119 Type 2 diabetes mellitus without complications: Secondary | ICD-10-CM

## 2019-09-20 DIAGNOSIS — Z0000 Encounter for general adult medical examination without abnormal findings: Secondary | ICD-10-CM

## 2019-09-20 DIAGNOSIS — I4891 Unspecified atrial fibrillation: Secondary | ICD-10-CM | POA: Diagnosis not present

## 2019-09-20 DIAGNOSIS — F5104 Psychophysiologic insomnia: Secondary | ICD-10-CM

## 2019-09-20 DIAGNOSIS — Z125 Encounter for screening for malignant neoplasm of prostate: Secondary | ICD-10-CM | POA: Diagnosis not present

## 2019-09-20 DIAGNOSIS — E7800 Pure hypercholesterolemia, unspecified: Secondary | ICD-10-CM | POA: Diagnosis not present

## 2019-09-20 LAB — COMPREHENSIVE METABOLIC PANEL
ALT: 22 U/L (ref 0–53)
AST: 18 U/L (ref 0–37)
Albumin: 4.9 g/dL (ref 3.5–5.2)
Alkaline Phosphatase: 68 U/L (ref 39–117)
BUN: 16 mg/dL (ref 6–23)
CO2: 29 mEq/L (ref 19–32)
Calcium: 9.9 mg/dL (ref 8.4–10.5)
Chloride: 101 mEq/L (ref 96–112)
Creatinine, Ser: 0.89 mg/dL (ref 0.40–1.50)
GFR: 86.65 mL/min (ref >60.00)
Glucose, Bld: 121 mg/dL — ABNORMAL HIGH (ref 70–99)
Potassium: 4.7 mEq/L (ref 3.5–5.1)
Sodium: 139 mEq/L (ref 135–145)
Total Bilirubin: 0.6 mg/dL (ref 0.2–1.2)
Total Protein: 7.4 g/dL (ref 6.0–8.3)

## 2019-09-20 LAB — MICROALBUMIN / CREATININE URINE RATIO
Creatinine,U: 145.2 mg/dL
Microalb Creat Ratio: 0.6 mg/g (ref 0.0–30.0)
Microalb, Ur: 0.8 mg/dL (ref 0.0–1.9)

## 2019-09-20 LAB — LIPID PANEL
Cholesterol: 266 mg/dL — ABNORMAL HIGH (ref 0–200)
HDL: 39.4 mg/dL (ref >39.00)
NonHDL: 226.43
Total CHOL/HDL Ratio: 7
Triglycerides: 236 mg/dL — ABNORMAL HIGH (ref 0.0–149.0)
VLDL: 47.2 mg/dL — ABNORMAL HIGH (ref 0.0–40.0)

## 2019-09-20 LAB — CBC WITH DIFFERENTIAL/PLATELET
Basophils Absolute: 0.1 10*3/uL (ref 0.0–0.1)
Basophils Relative: 0.6 % (ref 0.0–3.0)
Eosinophils Absolute: 0.1 10*3/uL (ref 0.0–0.7)
Eosinophils Relative: 1 % (ref 0.0–5.0)
HCT: 46.9 % (ref 39.0–52.0)
Hemoglobin: 16 g/dL (ref 13.0–17.0)
Lymphocytes Relative: 18.3 % (ref 12.0–46.0)
Lymphs Abs: 1.5 10*3/uL (ref 0.7–4.0)
MCHC: 34.1 g/dL (ref 30.0–36.0)
MCV: 90 fl (ref 78.0–100.0)
Monocytes Absolute: 0.8 10*3/uL (ref 0.1–1.0)
Monocytes Relative: 9 % (ref 3.0–12.0)
Neutro Abs: 6 10*3/uL (ref 1.4–7.7)
Neutrophils Relative %: 71.1 % (ref 43.0–77.0)
Platelets: 219 10*3/uL (ref 150.0–400.0)
RBC: 5.21 Mil/uL (ref 4.22–5.81)
RDW: 13 % (ref 11.5–15.5)
WBC: 8.4 10*3/uL (ref 4.0–10.5)

## 2019-09-20 LAB — LDL CHOLESTEROL, DIRECT: Direct LDL: 160 mg/dL

## 2019-09-20 LAB — HEMOGLOBIN A1C: Hgb A1c MFr Bld: 6 % (ref 4.6–6.5)

## 2019-09-20 LAB — TSH: TSH: 1.1 u[IU]/mL (ref 0.35–4.50)

## 2019-09-20 LAB — PSA: PSA: 0.82 ng/mL (ref 0.10–4.00)

## 2019-09-20 MED ORDER — CIPROFLOXACIN HCL 500 MG PO TABS: 500.0000 mg | ORAL_TABLET | Freq: Two times a day (BID) | ORAL | 2 refills | Status: AC

## 2019-09-20 MED ORDER — HYDROXYZINE PAMOATE 100 MG PO CAPS: ORAL_CAPSULE | ORAL | 1 refills | Status: DC

## 2019-09-20 MED ORDER — FLECAINIDE ACETATE 50 MG PO TABS: 50.0000 mg | ORAL_TABLET | Freq: Two times a day (BID) | ORAL | 1 refills | Status: DC

## 2019-09-20 NOTE — Patient Instructions (Signed)
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
## 2019-09-21 ENCOUNTER — Other Ambulatory Visit: Payer: Self-pay | Admitting: Family Medicine

## 2019-09-21 ENCOUNTER — Telehealth: Payer: Self-pay | Admitting: Family Medicine

## 2019-09-21 ENCOUNTER — Encounter: Payer: Self-pay | Admitting: Family Medicine

## 2019-09-21 ENCOUNTER — Other Ambulatory Visit (INDEPENDENT_AMBULATORY_CARE_PROVIDER_SITE_OTHER): Payer: BC Managed Care – PPO

## 2019-09-21 DIAGNOSIS — Z8249 Family history of ischemic heart disease and other diseases of the circulatory system: Secondary | ICD-10-CM

## 2019-09-21 LAB — C-REACTIVE PROTEIN: CRP: 1.5 mg/dL (ref 0.5–20.0)

## 2019-09-21 NOTE — Telephone Encounter (Signed)
Patient would like CRP and homocystein level drawn.  Patient told that his insurance would likely NOT cover these lab tests.  I requested pricing for homocystein level which was $227.22 and the CRP which is $18.00.  None of these fees include the venipuncture or specimen handling fees which are about $40-$50.  Patient understands and agrees to pay for CRP level- he scheduled lab appointment 09/25/2019.  He declined having homocystein level drawn at this time.   Please place order for CRP to go to [facility] lab.   Thank you!

## 2019-09-21 NOTE — Telephone Encounter (Signed)
OK, CRP ordered

## 2019-09-21 NOTE — Telephone Encounter (Signed)
Lab add on request faxed to [facility].   I will contact patient if they are able to add it on so we can cancel his lab appointment.

## 2019-09-24 ENCOUNTER — Other Ambulatory Visit: Payer: Self-pay

## 2019-09-24 DIAGNOSIS — E7800 Pure hypercholesterolemia, unspecified: Secondary | ICD-10-CM

## 2019-09-25 ENCOUNTER — Ambulatory Visit: Payer: BC Managed Care – PPO

## 2019-10-05 DIAGNOSIS — Z20828 Contact with and (suspected) exposure to other viral communicable diseases: Secondary | ICD-10-CM | POA: Diagnosis not present

## 2019-10-05 DIAGNOSIS — Z20822 Contact with and (suspected) exposure to covid-19: Secondary | ICD-10-CM | POA: Diagnosis not present

## 2019-10-05 DIAGNOSIS — R05 Cough: Secondary | ICD-10-CM | POA: Diagnosis not present

## 2019-10-07 DIAGNOSIS — R05 Cough: Secondary | ICD-10-CM | POA: Diagnosis not present

## 2019-10-07 DIAGNOSIS — R519 Headache, unspecified: Secondary | ICD-10-CM | POA: Diagnosis not present

## 2019-10-07 DIAGNOSIS — Z20822 Contact with and (suspected) exposure to covid-19: Secondary | ICD-10-CM | POA: Diagnosis not present

## 2019-10-07 DIAGNOSIS — Z20828 Contact with and (suspected) exposure to other viral communicable diseases: Secondary | ICD-10-CM | POA: Diagnosis not present

## 2019-10-08 ENCOUNTER — Telehealth: Payer: Self-pay | Admitting: Infectious Diseases

## 2019-10-08 ENCOUNTER — Other Ambulatory Visit: Payer: Self-pay | Admitting: Infectious Diseases

## 2019-10-08 DIAGNOSIS — U071 COVID-19: Secondary | ICD-10-CM

## 2019-10-08 NOTE — Telephone Encounter (Signed)
  I connected by phone with Shawn Morales on 10/08/2019 at 5:38 PM to discuss the potential use of an new treatment for mild to moderate COVID-19 viral infection in non-hospitalized patients.  This patient is a 64 y.o. male that meets the FDA criteria for Emergency Use Authorization of bamlanivimab or casirivimab\imdevimab.  Has a (+) direct SARS-CoV-2 viral test result  Has mild or moderate COVID-19   Is ? 62 years of age and weighs ? 40 kg  Is NOT hospitalized due to COVID-19  Is NOT requiring oxygen therapy or requiring an increase in baseline oxygen flow rate due to COVID-19  Is within 10 days of symptom onset  Has at least one of the high risk factor(s) for progression to severe COVID-19 and/or hospitalization as defined in EUA.  Specific high risk criteria : Diabetes   I have spoken and communicated the following to the patient or parent/caregiver:  1. FDA has authorized the emergency use of bamlanivimab and casirivimab\imdevimab for the treatment of mild to moderate COVID-19 in adults and pediatric patients with positive results of direct SARS-CoV-2 viral testing who are 43 years of age and older weighing at least 40 kg, and who are at high risk for progressing to severe COVID-19 and/or hospitalization.  2. The significant known and potential risks and benefits of bamlanivimab and casirivimab\imdevimab, and the extent to which such potential risks and benefits are unknown.  3. Information on available alternative treatments and the risks and benefits of those alternatives, including clinical trials.  4. Patients treated with bamlanivimab and casirivimab\imdevimab should continue to self-isolate and use infection control measures (e.g., wear mask, isolate, social distance, avoid sharing personal items, clean and disinfect "high touch" surfaces, and frequent handwashing) according to CDC guidelines.   5. The patient or parent/caregiver has the option to accept or refuse bamlanivimab or  casirivimab\imdevimab .  After reviewing this information with the patient, The patient agreed to proceed with receiving the bamlanimivab infusion and will be provided a copy of the Fact sheet prior to receiving the infusion.   Shawn Morales 10/08/2019 5:38 PM

## 2019-10-08 NOTE — Progress Notes (Signed)
See phone note from 10/08/2019  

## 2019-10-11 ENCOUNTER — Ambulatory Visit (HOSPITAL_COMMUNITY)
Admission: RE | Admit: 2019-10-11 | Discharge: 2019-10-11 | Disposition: A | Discharge status: Home / Self Care | Payer: BC Managed Care – PPO | Source: Ambulatory Visit | Attending: Pulmonary Disease | Admitting: Pulmonary Disease

## 2019-10-11 DIAGNOSIS — Z23 Encounter for immunization: Secondary | ICD-10-CM | POA: Diagnosis not present

## 2019-10-11 DIAGNOSIS — U071 COVID-19: Secondary | ICD-10-CM | POA: Diagnosis not present

## 2019-10-11 MED ORDER — METHYLPREDNISOLONE SODIUM SUCC 125 MG IJ SOLR: 125.0000 mg | Freq: Once | INTRAMUSCULAR | Status: DC | PRN

## 2019-10-11 MED ORDER — DIPHENHYDRAMINE HCL 50 MG/ML IJ SOLN: 50.0000 mg | Freq: Once | INTRAMUSCULAR | Status: DC | PRN

## 2019-10-11 MED ORDER — EPINEPHRINE 0.3 MG/0.3ML IJ SOAJ: 0.3000 mg | Freq: Once | INTRAMUSCULAR | Status: DC | PRN

## 2019-10-11 MED ORDER — SODIUM CHLORIDE 0.9 % IV SOLN
700.0000 mg | Freq: Once | INTRAVENOUS | Status: AC
  Administered 2019-10-11: 700 mg via INTRAVENOUS
  Filled 2019-10-11: qty 20

## 2019-10-11 MED ORDER — FAMOTIDINE IN NACL 20-0.9 MG/50ML-% IV SOLN: 20.0000 mg | Freq: Once | INTRAVENOUS | Status: DC | PRN

## 2019-10-11 MED ORDER — SODIUM CHLORIDE 0.9 % IV SOLN
INTRAVENOUS | Status: DC | PRN
  Administered 2019-10-11: 250 mL via INTRAVENOUS

## 2019-10-11 MED ORDER — ALBUTEROL SULFATE HFA 108 (90 BASE) MCG/ACT IN AERS: 2.0000 | INHALATION_SPRAY | Freq: Once | RESPIRATORY_TRACT | Status: DC | PRN

## 2019-10-11 NOTE — Discharge Instructions (Signed)

## 2019-10-11 NOTE — Progress Notes (Signed)
Patient ID: Shawn Morales, male   DOB: 1956-04-04, 64 y.o.   MRN: [id number]  Diagnosis: COVID-19  Physician:  Procedure: [facility] Med: bamlanivimab infusion - Provided patient with bamlanimivab fact sheet for patients, parents and caregivers prior to infusion.  Complications: No immediate complications noted.  Discharge: Discharged home   [name] 10/11/2019

## 2019-10-11 NOTE — Progress Notes (Signed)
Patient ID: Shawn Morales, male   DOB: 03/02/1956, 63 y.o.   MRN: [id number]  Diagnosis: COVID-19  Physician:  Procedure: [facility] Med: bamlanivimab infusion - Provided patient with bamlanimivab fact sheet for patients, parents and caregivers prior to infusion.  Complications: No immediate complications noted.  Discharge: Discharged home   Niesha Bame R 10/11/2019  

## 2019-11-14 ENCOUNTER — Telehealth: Payer: Self-pay

## 2019-11-14 NOTE — Telephone Encounter (Signed)
Patient called in upset about 09/19/18 OV billing. Patient was upset that he was billed for 2 visits, a physical and office visit.  Visit notes were reviewed by our billing staff.  Called patient back today to advise that patient also discussed sleeping issues and was rx'd a new medication which was not included in the physical.  Patient became angry & said it was dirty business and hung up.

## 2019-11-24 ENCOUNTER — Other Ambulatory Visit: Payer: Self-pay | Admitting: Family Medicine

## 2019-11-26 NOTE — Telephone Encounter (Signed)
RF request for flecainide and hydroxyzine.  Unsure of protocol for those.  Okay to refill?

## 2019-12-04 DIAGNOSIS — M75100 Unspecified rotator cuff tear or rupture of unspecified shoulder, not specified as traumatic: Secondary | ICD-10-CM | POA: Diagnosis not present

## 2019-12-04 DIAGNOSIS — M791 Myalgia, unspecified site: Secondary | ICD-10-CM | POA: Diagnosis not present

## 2020-01-01 DIAGNOSIS — M791 Myalgia, unspecified site: Secondary | ICD-10-CM | POA: Diagnosis not present

## 2020-01-01 DIAGNOSIS — M75100 Unspecified rotator cuff tear or rupture of unspecified shoulder, not specified as traumatic: Secondary | ICD-10-CM | POA: Diagnosis not present

## 2020-01-09 DIAGNOSIS — M75100 Unspecified rotator cuff tear or rupture of unspecified shoulder, not specified as traumatic: Secondary | ICD-10-CM | POA: Diagnosis not present

## 2020-01-09 DIAGNOSIS — M791 Myalgia, unspecified site: Secondary | ICD-10-CM | POA: Diagnosis not present

## 2020-01-12 DIAGNOSIS — Z23 Encounter for immunization: Secondary | ICD-10-CM | POA: Diagnosis not present

## 2020-01-21 DIAGNOSIS — M791 Myalgia, unspecified site: Secondary | ICD-10-CM | POA: Diagnosis not present

## 2020-01-21 DIAGNOSIS — M75100 Unspecified rotator cuff tear or rupture of unspecified shoulder, not specified as traumatic: Secondary | ICD-10-CM | POA: Diagnosis not present

## 2020-01-28 DIAGNOSIS — M791 Myalgia, unspecified site: Secondary | ICD-10-CM | POA: Diagnosis not present

## 2020-01-28 DIAGNOSIS — M75100 Unspecified rotator cuff tear or rupture of unspecified shoulder, not specified as traumatic: Secondary | ICD-10-CM | POA: Diagnosis not present

## 2020-02-04 DIAGNOSIS — M75100 Unspecified rotator cuff tear or rupture of unspecified shoulder, not specified as traumatic: Secondary | ICD-10-CM | POA: Diagnosis not present

## 2020-02-04 DIAGNOSIS — M791 Myalgia, unspecified site: Secondary | ICD-10-CM | POA: Diagnosis not present

## 2020-02-07 DIAGNOSIS — M19012 Primary osteoarthritis, left shoulder: Secondary | ICD-10-CM | POA: Diagnosis not present

## 2020-02-07 DIAGNOSIS — M19011 Primary osteoarthritis, right shoulder: Secondary | ICD-10-CM | POA: Diagnosis not present

## 2020-02-07 DIAGNOSIS — G8929 Other chronic pain: Secondary | ICD-10-CM | POA: Diagnosis not present

## 2020-02-07 DIAGNOSIS — M7521 Bicipital tendinitis, right shoulder: Secondary | ICD-10-CM | POA: Diagnosis not present

## 2020-04-21 DIAGNOSIS — Z0279 Encounter for issue of other medical certificate: Secondary | ICD-10-CM

## 2020-05-07 ENCOUNTER — Telehealth: Payer: Self-pay

## 2020-05-07 NOTE — Telephone Encounter (Signed)
Do you know if the records have been sent regarding this?  Client Shawn Morales [facility] Day - Client Client Site  [facility] [place] - Day Physician Santiago Bumpers - MD Contact Type Call Who Is Calling Patient / Member / Family / Caregiver Caller Name Reather Littler Phone Number [phone] Patient Name Shawn Morales Patient DOB 03-14-1956 Call Type Message Only Information Provided Reason for Call Request for General Office Information Initial Comment Caller states they are requesting records with the correct date of service. Additional Comment Caller from [facility] needs records for pts disability claim. REF# [id number] Disp. Time Disposition Final User 05/06/2020 6:00:12 PM General Information Provided Yes Kirby Funk Call Closed By: Kirby Funk Transaction Date/Time: 05/06/2020 5:55:30 PM (ET)

## 2020-05-07 NOTE — Telephone Encounter (Signed)
Yes, [name] has sent request to our records dept. They are on taking care of this.   Shawn Morales

## 2020-06-21 DIAGNOSIS — Z20822 Contact with and (suspected) exposure to covid-19: Secondary | ICD-10-CM | POA: Diagnosis not present

## 2020-06-21 DIAGNOSIS — Z8616 Personal history of COVID-19: Secondary | ICD-10-CM | POA: Diagnosis not present

## 2020-06-24 DIAGNOSIS — M7061 Trochanteric bursitis, right hip: Secondary | ICD-10-CM | POA: Diagnosis not present

## 2020-06-24 DIAGNOSIS — M7521 Bicipital tendinitis, right shoulder: Secondary | ICD-10-CM | POA: Diagnosis not present

## 2020-06-26 DIAGNOSIS — M25551 Pain in right hip: Secondary | ICD-10-CM | POA: Diagnosis not present

## 2020-06-26 DIAGNOSIS — R29898 Other symptoms and signs involving the musculoskeletal system: Secondary | ICD-10-CM | POA: Diagnosis not present

## 2020-06-26 DIAGNOSIS — M7061 Trochanteric bursitis, right hip: Secondary | ICD-10-CM | POA: Diagnosis not present

## 2020-08-19 DIAGNOSIS — M9908 Segmental and somatic dysfunction of rib cage: Secondary | ICD-10-CM | POA: Diagnosis not present

## 2020-08-19 DIAGNOSIS — M9903 Segmental and somatic dysfunction of lumbar region: Secondary | ICD-10-CM | POA: Diagnosis not present

## 2020-08-19 DIAGNOSIS — M9902 Segmental and somatic dysfunction of thoracic region: Secondary | ICD-10-CM | POA: Diagnosis not present

## 2020-08-19 DIAGNOSIS — M5414 Radiculopathy, thoracic region: Secondary | ICD-10-CM | POA: Diagnosis not present

## 2020-09-17 LAB — HM DIABETES EYE EXAM

## 2021-07-17 ENCOUNTER — Other Ambulatory Visit: Payer: Self-pay | Admitting: Family Medicine

## 2022-10-07 ENCOUNTER — Telehealth: Payer: Self-pay | Admitting: Family Medicine

## 2022-10-07 NOTE — Telephone Encounter (Signed)
Spoke with Shawn Morales, pt has not been seen in our office since 2020.

## 2022-10-07 NOTE — Telephone Encounter (Signed)
Connie with [facility] would like to verify blood work done here so that they do not duplicate any testing. Please give her a call at [phone] ext [phone]

## 2022-11-09 ENCOUNTER — Ambulatory Visit: Payer: Medicare Other | Admitting: Sports Medicine

## 2022-11-09 VITALS — BP 130/88 | Ht 73.0 in | Wt 198.0 lb

## 2022-11-09 DIAGNOSIS — G8929 Other chronic pain: Secondary | ICD-10-CM | POA: Diagnosis not present

## 2022-11-09 DIAGNOSIS — M25511 Pain in right shoulder: Secondary | ICD-10-CM | POA: Diagnosis not present

## 2022-11-09 DIAGNOSIS — M25512 Pain in left shoulder: Secondary | ICD-10-CM | POA: Diagnosis not present

## 2022-11-09 NOTE — Progress Notes (Unsigned)
Shawn Morales - 67 y.o. male MRN [id number]  Date of birth: 12-15-1955  SUBJECTIVE:  Including CC & ROS.  CC: Bilateral nighttime shoulder pain  HPI: Patient has had chronic bilateral shoulder pain. History of bilateral shoulder surgery in 2019, however has not found any relief. Most of his pain wakes him at night preventing him from being well rested. He is a side sleeper and cannot tolerate sleeping on his back due to OSA. Has visited sleep specialists before, did not tolerate CPAP. Sleeps with a wedge to offload some pressure on the side but this has not helped. Has visited multiple surgeons and is frustrated that he has not been given a definitive diagnosis as to what is causing his pain. Other than surgery, he has undergone multiple therapies including PT, ESWT, and steroid injections with no improvement. He was told at [facility] that he could undergo exploratory surgery again, but he is hesitant since his previous surgery felt unsuccessful. During the day he experiences little to no symptoms and has full range of motion and strength.     HISTORY: Past Medical, Surgical, Social, and Family History Reviewed & Updated per EMR.   Pertinent Historical Findings include: - 2019: bilateral shoulder arthroscopies with rotator cuff repair and bicep tenodesis  - Atrial fibrillation on Eliquis - OSA, not on CPAP  DATA REVIEWED: MR Shoulder Sep 2022 reviewed Office notes 11/08/22 with Justice Britain MD, [facility] reviewed  PHYSICAL EXAM:  VS: BP:   HR: bpm  TEMP: ( )  RESP:   HT:    WT:   BMI:  PHYSICAL EXAM: Shoulder: Inspection reveals no obvious deformity, atrophy, or asymmetry. No bruising. No swelling Palpation is normal with no TTP over [facility] joint or bicipital groove. Full ROM in flexion, abduction, internal/external rotation NV intact distally Normal scapular function observed. Special Tests:  - Impingement: Neg Hawkins, neers, empty can sign. - Supraspinatous: Negative empty can.  5/5  strength with resisted flexion at 20 degrees - Infraspinatous/Teres Minor: 5/5 strength with ER - Subscapularis: negative belly press, negative bear hug. 5/5 strength with IR - Biceps tendon: Negative Speeds, Yerrgason's  - Labrum: Negative Obriens, negative clunk, good stability - AC Joint: Negative cross arm - Negative apprehension test - No painful arc and no drop arm sign   ASSESSMENT & PLAN: See problem based charting & AVS for pt instructions.  1. Bilateral shoulder pain - Patient presents with nighttime shoulder pain of unclear etiology. Previous MR shows mild degenerative changes and postoperative changes, but no injury requiring repair or other obvious deformity. Physical exam shows no abnormalities.  Discussed at length with patient about other conservative options to help manage primary symptom of poor sleep due to shoulder pain. Did not recommend further diagnostic imaging given he has had a recent MRI and otherwise no concerning findings on exam. Recommended discussing with PCP about options such as seeing a sleep specialist for reevaluation or pain management in order to get better sleep at night. If these conservative measures do not resolve his symptoms, he could reconsider exploratory surgery again.   Estevan Oaks, MS4 [facility]  Patient seen and evaluated with the medical student.  I agree with the above plan of care.  Made a long discussion with the patient regarding his chronic shoulder pain.  His pain is only present at night with side sleeping.  He has no real pain during the day with activity.  I did review Dr. Augustin Coupe notes as well as his most recent  MRI of the left shoulder done in 2022.  MRI shows mild changes in the rotator cuff as well as the glenohumeral joint and AC joint.  Although exploratory arthroscopy is not unreasonable, I recommended that the patient first talk with his PCP about a sleep consultation to see if there is a way to address this  problem from that approach since it is primarily at nighttime pain that bothers him.  If that approach is unsuccessful and the patient may wish to proceed with arthroscopy with Dr. Onnie Graham as was discussed with Dr. Onnie Graham during his visit with him yesterday.  Follow-up with me as needed.  Total time spent with the patient was approximate 30 minutes  This note was dictated using Dragon naturally speaking software and may contain errors in syntax, spelling, or content which have not been identified prior to signing this note.

## 2023-01-04 ENCOUNTER — Encounter: Payer: Self-pay | Admitting: Family Medicine

## 2024-03-29 LAB — COLOGUARD

## 2024-04-16 LAB — COLOGUARD: COLOGUARD: NEGATIVE
